# Patient Record
Sex: Male | Born: 1955
Health system: Southern US, Community
[De-identification: ages and names within clinical notes are randomized; demographics above are authoritative.]

## PROBLEM LIST (undated history)

## (undated) DIAGNOSIS — I10 Essential (primary) hypertension: Secondary | ICD-10-CM

## (undated) DIAGNOSIS — G35 Multiple sclerosis: Secondary | ICD-10-CM

## (undated) HISTORY — DX: Essential (primary) hypertension: I10

## (undated) HISTORY — DX: Multiple sclerosis: G35

## (undated) HISTORY — PX: ACHILLES TENDON REPAIR: SUR1153

---

## 1999-01-14 ENCOUNTER — Ambulatory Visit (HOSPITAL_COMMUNITY): Admission: RE | Admit: 1999-01-14 | Discharge: 1999-01-14 | Payer: Self-pay | Admitting: Gastroenterology

## 1999-11-25 ENCOUNTER — Encounter: Admission: RE | Admit: 1999-11-25 | Discharge: 1999-11-25 | Payer: Self-pay | Admitting: Sports Medicine

## 2000-02-11 DIAGNOSIS — G35 Multiple sclerosis: Secondary | ICD-10-CM | POA: Insufficient documentation

## 2001-01-26 ENCOUNTER — Encounter: Payer: Self-pay | Admitting: Otolaryngology

## 2001-01-26 ENCOUNTER — Ambulatory Visit (HOSPITAL_COMMUNITY): Admission: RE | Admit: 2001-01-26 | Discharge: 2001-01-26 | Payer: Self-pay | Admitting: Otolaryngology

## 2002-12-03 ENCOUNTER — Encounter: Payer: Self-pay | Admitting: Internal Medicine

## 2002-12-03 ENCOUNTER — Ambulatory Visit (HOSPITAL_COMMUNITY): Admission: RE | Admit: 2002-12-03 | Discharge: 2002-12-03 | Payer: Self-pay | Admitting: Internal Medicine

## 2007-10-27 ENCOUNTER — Encounter: Admission: RE | Admit: 2007-10-27 | Discharge: 2007-10-27 | Payer: Self-pay | Admitting: Internal Medicine

## 2011-03-28 ENCOUNTER — Encounter: Payer: Self-pay | Admitting: Pharmacist

## 2011-03-28 ENCOUNTER — Ambulatory Visit (INDEPENDENT_AMBULATORY_CARE_PROVIDER_SITE_OTHER): Payer: Self-pay | Admitting: Pharmacist

## 2011-03-28 VITALS — BP 122/78 | HR 80 | Ht 69.5 in | Wt 177.0 lb

## 2011-03-28 DIAGNOSIS — G35 Multiple sclerosis: Secondary | ICD-10-CM

## 2011-03-28 NOTE — Progress Notes (Signed)
  Subjective:    Patient ID: Antonio Cook, male    DOB: 1955/11/22, 56 y.o.   MRN: [id number]  HPI  Reviewed and agree with Dr. Macky Lower management    Review of Systems     Objective:   Physical Exam        Assessment & Plan:

## 2011-03-28 NOTE — Progress Notes (Signed)
  Subjective:    Patient ID: Antonio Cook, male    DOB: [birth year], 56 y.o.   MRN: [id number]  HPI  Patient arrives in good spirits for medication review.  Reports seeing Elmore Guise as primary care provider, Dr Avie Echevaria as neurologist.  Reports being diagnosed with MS since 2002 and states he is under acceptable level of control.     Review of Systems     Objective:   Physical Exam        Assessment & Plan:  Following medication review, no suggestions for change.  Complete medication list provided to patient.  Total time in face to face medication review: 15 minutes.

## 2011-03-28 NOTE — Assessment & Plan Note (Signed)
Following medication review, no suggestions for change.  Complete medication list provided to patient.  Total time in face to face medication review: 15 minutes.  

## 2012-07-02 ENCOUNTER — Other Ambulatory Visit: Payer: Self-pay

## 2012-07-02 DIAGNOSIS — G35 Multiple sclerosis: Secondary | ICD-10-CM

## 2012-07-02 MED ORDER — ZOLPIDEM TARTRATE 10 MG PO TABS: 10.0000 mg | ORAL_TABLET | Freq: Every day | ORAL | Status: DC

## 2012-07-02 NOTE — Telephone Encounter (Signed)
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  His previous Rx has expired.  He is a former Dr Sandria Manly patient who has had a transfer of care to Dr Anne Hahn.  Dr Anne Hahn is out of the office today.  Forwarding request to Dr Dohmeier WID this afternoon.

## 2012-11-01 ENCOUNTER — Encounter: Payer: Self-pay | Admitting: Neurology

## 2012-11-25 ENCOUNTER — Encounter (INDEPENDENT_AMBULATORY_CARE_PROVIDER_SITE_OTHER): Payer: Self-pay

## 2012-11-25 ENCOUNTER — Ambulatory Visit (INDEPENDENT_AMBULATORY_CARE_PROVIDER_SITE_OTHER): Payer: 59 | Admitting: Neurology

## 2012-11-25 ENCOUNTER — Encounter: Payer: Self-pay | Admitting: Neurology

## 2012-11-25 VITALS — BP 120/82 | HR 68 | Wt 175.0 lb

## 2012-11-25 DIAGNOSIS — G35 Multiple sclerosis: Secondary | ICD-10-CM

## 2012-11-25 MED ORDER — INTERFERON BETA-1A 44 MCG/0.5ML ~~LOC~~ SOLN: 44.0000 ug | SUBCUTANEOUS | Status: DC

## 2012-11-25 MED ORDER — ZOLPIDEM TARTRATE 10 MG PO TABS: 10.0000 mg | ORAL_TABLET | Freq: Every day | ORAL | Status: DC

## 2012-11-25 MED ORDER — MODAFINIL 200 MG PO TABS: 200.0000 mg | ORAL_TABLET | Freq: Every day | ORAL | Status: DC | PRN

## 2012-11-25 NOTE — Progress Notes (Signed)
   Reason for visit: Multiple sclerosis  BASHIR MARCHETTI is an 57 y.o. 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 The patient presented with facial myokymia, and decreased hearing. The patient had MRI evaluation that revealed evidence of typical demyelinating lesions that included the cerebellar peduncle. The patient has been placed on Rebif, and he has done well since being on this medication. Since 2002, the patient indicates that he has not had any clinical exacerbations. The patient does have some fatigue at times. The patient is tolerating the Rebif well, and he indicates that he gets annual blood work through his primary care physician that includes a CBC and a comprehensive metabolic profile. The patient has not had a MRI of the brain since 2002, and the patient is not interested in getting another one.  Past Medical History  Diagnosis Date  . MS (multiple sclerosis)     Past Surgical History  Procedure Laterality Date  . Achilles tendon repair Left     Family History  Problem Relation Age of Onset  . Lymphoma Mother   . Cancer Father   . Cancer Maternal Grandfather     Social history:  reports that he has never smoked. He has never used smokeless tobacco. He reports that he does not drink alcohol or use illicit drugs.   No Known Allergies  Medications:  No current outpatient prescriptions on file prior to visit.   No current facility-administered medications on file prior to visit.    ROS:  Out of a complete 14 system review of symptoms, the patient complains only of the following symptoms, and all other reviewed systems are negative.  Fatigue  Blood pressure 120/82, pulse 68, weight 175 lb (79.379 kg).  Physical Exam  General: The patient is alert and cooperative at the time of the examination.  Skin: No significant peripheral edema is  noted.   Neurologic Exam  Cranial nerves: Facial symmetry is present. Speech is normal, no aphasia or dysarthria is noted. Extraocular movements are full. Visual fields are full. Pupils are equal, round, and reactive to light. Discs are flat bilaterally.  Motor: The patient has good strength in all 4 extremities.  Coordination: The patient has good finger-nose-finger and heel-to-shin bilaterally.  Gait and station: The patient has a normal gait. Tandem gait is normal. Romberg is negative. No drift is seen.  Reflexes: Deep tendon reflexes are symmetric.   Assessment/Plan:  One. Multiple sclerosis  The patient has been quite stable on Rebif. The patient likely has a benign form of multiple sclerosis. The patient will be maintained on this medication for now. He will followup in one year.  Marlan Palau MD 11/25/2012 8:39 PM  [facility] [address] [place], [place] [address]  Phone [phone] Fax [phone]

## 2013-06-01 ENCOUNTER — Other Ambulatory Visit: Payer: Self-pay

## 2013-06-01 DIAGNOSIS — G35 Multiple sclerosis: Secondary | ICD-10-CM

## 2013-06-01 MED ORDER — ZOLPIDEM TARTRATE 10 MG PO TABS: 10.0000 mg | ORAL_TABLET | Freq: Every day | ORAL | Status: DC

## 2013-06-01 NOTE — Telephone Encounter (Signed)
Rx signed and faxed.

## 2013-11-24 ENCOUNTER — Encounter (INDEPENDENT_AMBULATORY_CARE_PROVIDER_SITE_OTHER): Payer: Self-pay

## 2013-11-24 ENCOUNTER — Encounter: Payer: Self-pay | Admitting: Neurology

## 2013-11-24 ENCOUNTER — Ambulatory Visit (INDEPENDENT_AMBULATORY_CARE_PROVIDER_SITE_OTHER): Payer: 59 | Admitting: Neurology

## 2013-11-24 VITALS — BP 126/80 | HR 85 | Ht 69.0 in | Wt 172.6 lb

## 2013-11-24 DIAGNOSIS — G35 Multiple sclerosis: Secondary | ICD-10-CM

## 2013-11-24 MED ORDER — MODAFINIL 200 MG PO TABS: 200.0000 mg | ORAL_TABLET | Freq: Every day | ORAL | Status: DC | PRN

## 2013-11-24 MED ORDER — INTERFERON BETA-1A 44 MCG/0.5ML ~~LOC~~ SOLN: 44.0000 ug | SUBCUTANEOUS | Status: DC

## 2013-11-24 MED ORDER — ZOLPIDEM TARTRATE 10 MG PO TABS: 10.0000 mg | ORAL_TABLET | Freq: Every day | ORAL | Status: DC

## 2013-11-24 NOTE — Progress Notes (Signed)
    Reason for visit: Multiple sclerosis  Antonio Cook is an 58 y.o. 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 The patient has done quite well for number of years on Rebif, without any further clinical exacerbations. The patient reports no new symptoms of vision changes, numbness or weakness, balance issues, or difficulty with control of the bowels or the bladder. The patient has not had any significant new medical issues that have come up since last seen other than the fact that he is now being treated for hypertension. The patient is tolerating the Rebif quite well. He indicates that his primary care physician checks a CBC and liver panel on an annual basis. He does report some fatigue issues, but he takes Provigil on occasion if needed.  Past Medical History  Diagnosis Date  . MS (multiple sclerosis)   . Hypertension     Past Surgical History  Procedure Laterality Date  . Achilles tendon repair Left     Family History  Problem Relation Age of Onset  . Lymphoma Mother   . Cancer Father   . Cancer Maternal Grandfather   . Multiple sclerosis Neg Hx     Social history:  reports that he has never smoked. He has never used smokeless tobacco. He reports that he does not drink alcohol or use illicit drugs.    Allergies  Allergen Reactions  . Hydrochloric Acid Itching and Rash    Medications:  No current outpatient prescriptions on file prior to visit.   No current facility-administered medications on file prior to visit.    ROS:  Out of a complete 14 system review of symptoms, the patient complains only of the following symptoms, and all other reviewed systems are negative.  Fatigue  Blood pressure 126/80, pulse 85, height 5\' 9"  (1.753 m), weight 172 lb 9.6 oz (78.291 kg).  Physical Exam  General: The patient is alert and cooperative at the time of the  examination.  Skin: No significant peripheral edema is noted.   Neurologic Exam  Mental status: The patient is oriented x 3.  Cranial nerves: Facial symmetry is present. Speech is normal, no aphasia or dysarthria is noted. Extraocular movements are full. Visual fields are full. Pupils are equal, round, and reactive to light. Discs are flat bilaterally.  Motor: The patient has good strength in all 4 extremities.  Sensory examination: Soft touch sensation is symmetric on the face, arms, and legs.  Coordination: The patient has good finger-nose-finger and heel-to-shin bilaterally.  Gait and station: The patient has a normal gait. Tandem gait is normal. Romberg is negative. No drift is seen.  Reflexes: Deep tendon reflexes are symmetric.   Assessment/Plan:  1. Multiple sclerosis  The patient is doing quite well on Rebif. The patient will continue this medication. He was given a prescription for Rebif, Ambien, and for Provigil. He will followup in one year or as needed.  Marlan Palau. Keith Jeremaih Klima MD 11/24/2013 8:32 PM  [facility] [address] [place], [place] [id number]  Phone [phone] Fax [phone]

## 2013-11-24 NOTE — Patient Instructions (Signed)

## 2014-01-10 ENCOUNTER — Other Ambulatory Visit: Payer: Self-pay | Admitting: Internal Medicine

## 2014-01-10 DIAGNOSIS — M545 Low back pain, unspecified: Secondary | ICD-10-CM

## 2014-01-10 DIAGNOSIS — R200 Anesthesia of skin: Secondary | ICD-10-CM

## 2014-01-10 DIAGNOSIS — G35 Multiple sclerosis: Secondary | ICD-10-CM

## 2014-01-17 ENCOUNTER — Ambulatory Visit (INDEPENDENT_AMBULATORY_CARE_PROVIDER_SITE_OTHER): Payer: 59 | Admitting: Sports Medicine

## 2014-01-17 ENCOUNTER — Encounter: Payer: Self-pay | Admitting: Sports Medicine

## 2014-01-17 VITALS — BP 137/88 | HR 90 | Ht 69.0 in | Wt 172.0 lb

## 2014-01-17 DIAGNOSIS — G35 Multiple sclerosis: Secondary | ICD-10-CM

## 2014-01-17 DIAGNOSIS — M25551 Pain in right hip: Secondary | ICD-10-CM

## 2014-01-17 NOTE — Assessment & Plan Note (Signed)
I do not think that MS is likely cause of current sxs  These seem injury related

## 2014-01-17 NOTE — Assessment & Plan Note (Signed)
We will use HEP to stabilize hip and core  stretch for piriformis daily  Finish prednisone taper  I think this will improve a lot with HEP and i want to recheck him in 6 weeks to see his response  I do not think he has discogenic pain

## 2014-01-17 NOTE — Progress Notes (Signed)
Patient ID: Antonio Cook, male   DOB: [birth year], 58 y.o.   MRN: [id number]  Subjective:   Patient presents with right buttock pain for the past 6 weeks.  Pain started while carrying laundry up stairs.  The pain was initially localized to his right lateral buttock.  He was seen by PCP and diagnosed with piriformis syndrome and started on scheduled Naprosyn which he has been taking for the past 6 weeks.  He had a deep tissue massage ~10 days ago and 2-3 days after this, he developed pain in right leg involving his posterior thigh, anterior knee, and anterior/lateral lower leg.  At this same time, he also developed sensation of numbness in his right foot.  He returned to PCP and was started on Prednisone taper which has resolved this right leg pain.  An MRI of his lumbar spine was also ordered but has not been performed.  He still has persistent right buttock pain and sensory deficit in his right foot but is significantly improved overall.  Right buttock pain is worse with prolonged sitting.  No fever, chills, night sweats, unexpected weight loss.  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  Past:  MS but no active lesions or sxs recently  Objective: BP 137/88 mmHg  Pulse 90  Ht 5\' 9"  (1.753 m)  Wt 172 lb (78.019 kg)  BMI 25.39 kg/m2 General: calm, cooperative, NAD HEENT: conj clear, sclera anicteric, [place]/AT Respiratory: breathing non-labored Cardiac: lower extremity pulses intact Neurologic: No obvious lower extremity sensory deficit; 4/5 strength in right extensor hallucis longus but strength otherwise normal/equal bilateral lower extremities; patellar and achilles reflexes normal/equal bilaterally Musculoskeletal: BACK AND HIP  Inspection: no gross deformity or leg length discrepancy  Palpation: TTP over right piriformis; no tenderness over greater trochanter or ASIS  Slump: negative bilaterally  Seated SLR: negative bilaterally  Supine SLR: negative bilaterally  FABER: negative  bilaterally  FADIR: negative bilaterally  Piriformis testing: reproduced right buttock pain  Hip Abduction: profoundly weak on right  Heel walking: difficulty on right due to extensor hallucis weakness  Internal Rotation of Hips: negative  Balance is poor on 1 leg stance  Assessment/Plan:  Patient is a 58 y/o M with history of multiple sclerosis who presents with right buttock pain and foot numbness with resolved right leg pain.    1.  Right Piriformis Syndrome 2.  Right Hip Abductor weakness 3.  Right Extensor Hallucis Longus weakness 4.  Multiple Sclerosis   It is likely that the initial injury to his piriformis occurred secondary to weak right hip abduction and poor core strength.  Subsequent deep tissue massage may have caused sciatic irritation which could explain his right leg pain/foot numbness.  He clearly seems to have responded to NSAIDS and prednisone course and has no reproducible radiculopathy on exam today.  Agree with previously ordered MRI of lumbar spine, especially in the setting of multiple sclerosis, to rule out plaque lesion.  Extensor hallucis longus weakness may be secondary to compressive neuropathy from deep tissue massage, reflex inhibition, or less likely a herniated disk.  Will do home exercises with focus on strengthening core and right hip abductors.  Patient can continue Prednisone and NSAIDS per PCP discretion although he was made aware that taking these together is not advised due to increased risk of GI side effects.  Patient to follow-up in 6 weeks.  Patient examined and discussed with Dr. Darrick Penna.  Mickle Plumb, MD PGY-3 Family Medicine  Reviewed and  edited/  Agree with this assessment Sterling BigKB Latorie Montesano, MD

## 2014-01-18 ENCOUNTER — Ambulatory Visit
Admission: RE | Admit: 2014-01-18 | Discharge: 2014-01-18 | Disposition: A | Discharge status: Home / Self Care | Payer: 59 | Source: Ambulatory Visit | Attending: Internal Medicine | Admitting: Internal Medicine

## 2014-01-18 DIAGNOSIS — G35 Multiple sclerosis: Secondary | ICD-10-CM

## 2014-01-18 DIAGNOSIS — M545 Low back pain, unspecified: Secondary | ICD-10-CM

## 2014-01-18 DIAGNOSIS — R200 Anesthesia of skin: Secondary | ICD-10-CM

## 2014-01-18 MED ORDER — GADOBENATE DIMEGLUMINE 529 MG/ML IV SOLN
15.0000 mL | Freq: Once | INTRAVENOUS | Status: AC | PRN
  Administered 2014-01-18: 15 mL via INTRAVENOUS

## 2014-01-24 ENCOUNTER — Ambulatory Visit: Payer: 59 | Admitting: Sports Medicine

## 2014-03-02 ENCOUNTER — Ambulatory Visit: Payer: 59 | Admitting: Sports Medicine

## 2014-03-09 ENCOUNTER — Ambulatory Visit (INDEPENDENT_AMBULATORY_CARE_PROVIDER_SITE_OTHER): Payer: 59 | Admitting: Sports Medicine

## 2014-03-09 ENCOUNTER — Encounter: Payer: Self-pay | Admitting: Sports Medicine

## 2014-03-09 VITALS — BP 122/86 | Ht 69.0 in | Wt 172.0 lb

## 2014-03-09 DIAGNOSIS — G35 Multiple sclerosis: Secondary | ICD-10-CM

## 2014-03-09 DIAGNOSIS — Z7189 Other specified counseling: Secondary | ICD-10-CM

## 2014-03-09 DIAGNOSIS — M25551 Pain in right hip: Secondary | ICD-10-CM

## 2014-03-10 NOTE — Progress Notes (Signed)
  Antonio Cook - 59 y.o. male MRN [id number]  Date of birth: [birth year]  SUBJECTIVE: CC: Right hip pain, follow-up HPI: Significant improvement following last visit. He has been performing exercises every 2-3 days. Reports essentially asymptomatic at this time. He is interested in discussing options for resuming his activity and increasing his health.  ROS: per HPI  HISTORY:  Past Medical, Surgical, Social, and Family History reviewed & updated per EMR.  Pertinent Historical Findings include:  reports that he has never smoked. He has never used smokeless tobacco. Nonsmoker, history of multiple sclerosis with minimal daily symptoms. Prior left Achilles repair Physician, internal medicine and psychiatrist Exercises for health  OBJECTIVE:  VS:   HT:5\' 9"  (175.3 cm)   WT:172 lb (78.019 kg)  BMI:25.5          BP:122/86 mmHg  HR: bpm  TEMP: ( )  RESP:   PHYSICAL EXAM:  Adult Caucasian male no acute distress he is alert and appropriately interactive. Good insight. Right hip exam: Strength is 5/5 bilaterally and hip abduction. He has TFL predominant on the right more than the left. He is nontender over the greater trochanter, piriformis, sciatic notch. Bilateral negative straight leg raise. He is able to heel and toe walk without difficulty.  ASSESSMENT: 1. Multiple sclerosis   2. Right hip pain   3. Counseling and coordination of care     PLAN: See problem based charting & AVS for additional documentation.  >50% of this 25 minute visit spent in direct patient counseling and/or coordination of care.   Discussed ACSM recommendations for both aerobic exercises and moderate intensity and exercise. Provided core strengthening regimen.   Rx Today: Exercise prescription  HEP: Core exercise regimen reviewed. Encouraged complex movement exercises. > Return if symptoms worsen or fail to improve.

## 2014-05-26 ENCOUNTER — Other Ambulatory Visit: Payer: Self-pay

## 2014-05-26 DIAGNOSIS — G35 Multiple sclerosis: Secondary | ICD-10-CM

## 2014-05-26 MED ORDER — ZOLPIDEM TARTRATE 10 MG PO TABS: 10.0000 mg | ORAL_TABLET | Freq: Every day | ORAL | Status: DC

## 2014-05-26 MED ORDER — MODAFINIL 200 MG PO TABS: 200.0000 mg | ORAL_TABLET | Freq: Every day | ORAL | Status: DC | PRN

## 2014-05-26 NOTE — Telephone Encounter (Signed)
Rx signed and faxed.

## 2014-10-05 ENCOUNTER — Ambulatory Visit (INDEPENDENT_AMBULATORY_CARE_PROVIDER_SITE_OTHER): Payer: 59 | Admitting: Sports Medicine

## 2014-10-05 ENCOUNTER — Encounter: Payer: Self-pay | Admitting: Sports Medicine

## 2014-10-05 VITALS — BP 119/87 | Ht 69.0 in | Wt 176.0 lb

## 2014-10-05 DIAGNOSIS — R29898 Other symptoms and signs involving the musculoskeletal system: Secondary | ICD-10-CM | POA: Diagnosis not present

## 2014-10-05 DIAGNOSIS — G35 Multiple sclerosis: Secondary | ICD-10-CM | POA: Diagnosis not present

## 2014-10-05 NOTE — Assessment & Plan Note (Signed)
I am concerned that his left lower extremity weakness is related to his MS  I would like him to return for neurology evaluation as this is a new change

## 2014-10-05 NOTE — Progress Notes (Signed)
Patient ID: Antonio Cook, male   DOB: 1955-03-06, 59 y.o.   MRN: [id number]  CC: left leg weakness  HPI:  59 year old man with PMH of benign MS here with complaint of left leg weakness x 6 weeks His MS has really not given him MSK issues in past 14 years  He notes that he gradually began to notice that the leg felt weak and unstable in relation to the other when walking No falls or trauma No pain, numbness, or tingling He states that it is not so pronounced that it is affecting him (was able to hike with his wife in [place] 3 weeks ago)  However, in [place] he used a knee brace as that made him feel more stable while walking  He has been feeling like it has improved in the past week Very worried that it is his first flare of MS (MS was diagnosed incidentally on brain MRI for hearing change 14 years ago) Takes MS medication daily (Rebif) Has had MRI of back in last year that showed bulging disc in lumbar region that feels could also contribute to this  Dr Anne Hahn follows him for neuro issues  BP 119/87 mmHg  Ht  (1.753 m)  Wt 176 lb (79.833 kg)  BMI 25.98 kg/m2 General: very pleasant, hygienic, no acute distress CV: well perfused, skin warm/pink Resp: normal WOB MSK:  Left lower extremity globally weaker than right in all planes of testing (hip flex/ext, knee flex/ext, ankle plantar/dorsiflexion, hip abduction). About 70-80% of strength of right. No fasciculations observed during prolonged strength testing.   Sensation grossly intact.  Reflexes symmetrical bilaterally, 1+.   Gait analysis: left leg with marked tremor on weight bearing as if unstable, left leg also tracking outward in relation to right.  Unablee to do toe walk 2/2 weakness but no pain/ same problem with heel walk/ i leg stand is unstable   Assessment:  59 year old man with PMH of benign MS here with complaint of left leg weakness x 6 weeks. He is concerned that it is an MS flare vs bulging disc. Based on the lack  of pain, numbness, tingling,neg exam for disk issues,  it is doubtful that it is a disc bulge. The global nature of the weakness and gait instability seems more associated with an MS flare.   PLAN:  Will recommend that pt try to get in with his neurologist as fast as possible for possible imaging vs medication titration Will recommend that he walk with a cane or other assistive device until strength can be maintained Will recommend that he avoid vigorous exercise that may endanger the stability of his leg.  Will recommend knee brace until strength can be maintained Pt can continue his passive leg lifts and will recommend isometric exercises to help maintain tone.   Enid Baas

## 2014-10-06 ENCOUNTER — Telehealth: Payer: Self-pay | Admitting: Neurology

## 2014-10-06 NOTE — Telephone Encounter (Signed)
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 Darrick Penna and also MS. The patient does have an appointment scheduled on 10-11-14 but would like a call back from Dr. Anne Hahn to discuss . I advised the patient it would probably be Monday for the call to be returned because Dr. Anne Hahn and his nurse are out of the office today. The patient was ok with that.  Thank you.

## 2014-10-06 NOTE — Telephone Encounter (Signed)
I called, and talked with the patient. 2 months ago, he had onset of some primarily motor changes in the left leg, feels as if he is dragging the leg some. No involvement of the arms. No sensory changes. The problem improved slightly, but it has not normalized. This may be a MS exacerbation, may consider MRI of the brain and cervical spinal cord with and without gadolinium, further blood work. He will be seen next week.

## 2014-10-09 ENCOUNTER — Encounter: Payer: Self-pay | Admitting: Sports Medicine

## 2014-10-11 ENCOUNTER — Encounter: Payer: Self-pay | Admitting: Neurology

## 2014-10-11 ENCOUNTER — Ambulatory Visit (INDEPENDENT_AMBULATORY_CARE_PROVIDER_SITE_OTHER): Payer: 59 | Admitting: Neurology

## 2014-10-11 VITALS — BP 140/97 | HR 89 | Ht 69.0 in | Wt 178.5 lb

## 2014-10-11 DIAGNOSIS — Z5181 Encounter for therapeutic drug level monitoring: Secondary | ICD-10-CM | POA: Diagnosis not present

## 2014-10-11 DIAGNOSIS — R29898 Other symptoms and signs involving the musculoskeletal system: Secondary | ICD-10-CM

## 2014-10-11 DIAGNOSIS — G35 Multiple sclerosis: Secondary | ICD-10-CM | POA: Diagnosis not present

## 2014-10-11 MED ORDER — ONDANSETRON HCL 4 MG PO TABS: 4.0000 mg | ORAL_TABLET | Freq: Three times a day (TID) | ORAL | Status: AC | PRN

## 2014-10-11 NOTE — Patient Instructions (Addendum)
   We will check MRI of the brain and cervical spine, check blood work.   Multiple Sclerosis Multiple sclerosis (MS) is a disease of the central nervous system. It leads to the loss of the insulating covering of the nerves (myelin sheath) of your brain. When this happens, brain signals do not get sent properly or may not get sent at all. The age of onset of MS varies.  CAUSES The cause of MS is unknown. However, it is more common in the [place] than in the [place]. RISK FACTORS There is a higher number of women with MS than men. MS is not an illness that is passed down to you from your family members (inherited). However, your risk of MS is higher if you have a relative with MS. SIGNS AND SYMPTOMS  The symptoms of MS occur in episodes or attacks. These attacks may last weeks to months. There may be long periods of almost no symptoms between attacks. The symptoms of MS vary. This is because of the many different ways it affects the central nervous system. The main symptoms of MS include:  Vision problems and eye pain.  Numbness.  Weakness.  Inability to move your arms, hands, feet, or legs (paralysis).  Balance problems.  Tremors. DIAGNOSIS  Your health care provider can diagnose MS with the help of imaging exams and lab tests. These may include specialized X-ray exams and spinal fluid tests. The best imaging exam to confirm a diagnosis of MS is an MRI. TREATMENT  There is no known cure for MS, but there are medicines that can decrease the number and frequency of attacks. Steroids are often used for short-term relief. Physical and occupational therapy may also help. There are also many new alternative or complementary treatments available to help control the symptoms of MS. Ask your health care provider if any of these other options are right for you. HOME CARE INSTRUCTIONS   Take medicines as directed by your health care provider.  Exercise as directed by  your health care provider. SEEK MEDICAL CARE IF: You begin to feel depressed. SEEK IMMEDIATE MEDICAL CARE IF:  You develop paralysis.  You have problems with bladder, bowel, or sexual function.  You develop mental changes, such as forgetfulness or mood swings.  You have a period of uncontrolled movements (seizure). Document Released: 01/25/2000 Document Revised: 02/01/2013 Document Reviewed: 10/04/2012 [facility] Patient Information 2015 [place], [place]. This information is not intended to replace advice given to you by your health care provider. Make sure you discuss any questions you have with your health care provider.

## 2014-10-11 NOTE — Progress Notes (Signed)
Reason for visit:  Multiple sclerosis  Antonio Cook is an 59 y.o. 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 The patient has had no exacerbations , but within the last 2 or 3 months, he has developed some slight weakness of the left leg, he feels as if he has to drag the leg in order to walk, he has not had any falls. The weakness seemed to improve initially, but now has plateaued over the last month or so. He denies any weakness, tingling sensations, or discomfort. He denies any problems with the arms, or with the right leg. He denies any issues controlling the bowels or the bladder, or any visual complaints, or memory problems. He has not had MRI evaluation of the brain in quite some time, the last documentation is in 2002. He comes to this office for an evaluation.  Past Medical History  Diagnosis Date  . MS (multiple sclerosis)   . Hypertension     Past Surgical History  Procedure Laterality Date  . Achilles tendon repair Left     Family History  Problem Relation Age of Onset  . Lymphoma Mother   . Cancer Father   . Cancer Maternal Grandfather   . Multiple sclerosis Neg Hx     Social history:  reports that he has never smoked. He has never used smokeless tobacco. He reports that he does not drink alcohol or use illicit drugs.    Allergies  Allergen Reactions  . Gadolinium Derivatives Nausea And Vomiting    Pt vomited after injection.   Marland Kitchen Hctz [Hydrochlorothiazide] Rash    Medications:  Prior to Admission medications   Medication Sig Start Date End Date Taking? Authorizing Provider  interferon beta-1a (REBIF) 44 MCG/0.5ML injection Inject 0.5 mLs (44 mcg total) into the skin 3 (three) times a week. M, W, F usual dosing days 11/24/13  Yes York Spaniel, MD  losartan (COZAAR) 100 MG tablet Take 100 mg by mouth daily.    Yes Historical Provider, MD  modafinil (PROVIGIL) 200 MG tablet Take 1 tablet (200 mg total) by mouth daily as needed. 05/26/14  Yes York Spaniel, MD  zolpidem (AMBIEN) 10 MG tablet Take 1 tablet (10 mg total) by mouth at bedtime. 05/26/14  Yes York Spaniel, MD    ROS:  Out of a complete 14 system review of symptoms, the patient complains only of the following symptoms, and all other reviewed systems are negative.   Left leg weakness  Blood pressure 140/97, pulse 89, height  (1.753 m), weight 178 lb 8 oz (80.967 kg).  Physical Exam  General: The patient is alert and cooperative at the time of the examination.  Skin: No significant peripheral edema is noted.   Neurologic Exam  Mental status: The patient is alert and oriented x 3 at the time of the examination. The patient has apparent normal recent and remote memory, with an apparently normal attention span and concentration ability.   Cranial nerves: Facial symmetry is present. Speech is normal, no aphasia or dysarthria is noted. Extraocular movements are full. Visual fields are full.  Pupils are equal, round, and reactive to light. Discs are flat bilaterally.  Motor: The patient has good strength in all 4 extremities.  Sensory examination: Soft touch sensation is symmetric on the face, arms, and legs.  Pinprick ,  soft touch, and vibration sensation are symmetric throughout.  Coordination: The patient has good finger-nose-finger and heel-to-shin bilaterally.  Gait and station: The patient has a normal gait. Tandem gait is normal. Romberg is negative. No drift is seen.  Reflexes: Deep tendon reflexes are symmetric.   Assessment/Plan:   1. Multiple sclerosis   The patient perceives some weakness of the left leg. He appears to have a relatively normal gait pattern, no observable weakness is seen on clinical examination. He may have had a mild MS exacerbation. He remains on Rebif. He is taking a low dose of this  drug. The patient will be set up for blood work today, we will consider getting beta interferon neutralizing antibodies. He wishes to be sent to physical therapy. He will follow-up in October 2016. MRI of the brain and cervical spine will be done.  Marlan Palau MD 10/11/2014 9:22 PM  [facility] [address] [place], [place] [address]  Phone [phone] Fax [phone]

## 2014-10-12 ENCOUNTER — Telehealth: Payer: Self-pay | Admitting: Neurology

## 2014-10-12 LAB — COMPREHENSIVE METABOLIC PANEL
ALT: 15 IU/L (ref 0–44)
AST: 14 IU/L (ref 0–40)
Albumin/Globulin Ratio: 1.7 (ref 1.1–2.5)
Albumin: 4.3 g/dL (ref 3.5–5.5)
Alkaline Phosphatase: 67 IU/L (ref 39–117)
BUN/Creatinine Ratio: 20 (ref 9–20)
BUN: 21 mg/dL (ref 6–24)
Bilirubin Total: <0.2 mg/dL (ref 0.0–1.2)
CO2: 22 mmol/L (ref 18–29)
Calcium: 9.3 mg/dL (ref 8.7–10.2)
Chloride: 102 mmol/L (ref 97–108)
Creatinine, Ser: 1.07 mg/dL (ref 0.76–1.27)
GFR calc Af Amer: 87 mL/min/{1.73_m2} (ref >59)
GFR calc non Af Amer: 76 mL/min/{1.73_m2} (ref >59)
Globulin, Total: 2.5 g/dL (ref 1.5–4.5)
Glucose: 106 mg/dL — ABNORMAL HIGH (ref 65–99)
Potassium: 4.5 mmol/L (ref 3.5–5.2)
Sodium: 141 mmol/L (ref 134–144)
Total Protein: 6.8 g/dL (ref 6.0–8.5)

## 2014-10-12 LAB — CBC WITH DIFFERENTIAL/PLATELET
Basophils Absolute: 0 10*3/uL (ref 0.0–0.2)
Basos: 0 %
EOS (ABSOLUTE): 0.2 10*3/uL (ref 0.0–0.4)
Eos: 2 %
Hematocrit: 42.1 % (ref 37.5–51.0)
Hemoglobin: 13.9 g/dL (ref 12.6–17.7)
Immature Grans (Abs): 0 10*3/uL (ref 0.0–0.1)
Immature Granulocytes: 0 %
Lymphocytes Absolute: 1.4 10*3/uL (ref 0.7–3.1)
Lymphs: 16 %
MCH: 28.3 pg (ref 26.6–33.0)
MCHC: 33 g/dL (ref 31.5–35.7)
MCV: 86 fL (ref 79–97)
Monocytes Absolute: 0.9 10*3/uL (ref 0.1–0.9)
Monocytes: 11 %
Neutrophils Absolute: 5.8 10*3/uL (ref 1.4–7.0)
Neutrophils: 71 %
Platelets: 194 10*3/uL (ref 150–379)
RBC: 4.92 x10E6/uL (ref 4.14–5.80)
RDW: 13.3 % (ref 12.3–15.4)
WBC: 8.3 10*3/uL (ref 3.4–10.8)

## 2014-10-12 NOTE — Telephone Encounter (Signed)
Patient called regarding physical therapy referral. Please call patient [phone].

## 2014-10-13 LAB — OTHER LAB TEST

## 2014-10-16 NOTE — Telephone Encounter (Signed)
I called the patient. The physical therapy order family has already been placed, and he has not heard anything in the next 3 or 4 days, he is to contact me.

## 2014-10-17 ENCOUNTER — Telehealth: Payer: Self-pay

## 2014-10-17 ENCOUNTER — Telehealth: Payer: Self-pay | Admitting: Neurology

## 2014-10-17 NOTE — Telephone Encounter (Signed)
Left voicemail relaying results. 

## 2014-10-17 NOTE — Telephone Encounter (Signed)
I called the patient and left a voicemail. The patient's referral was sent to [facility] office (phone number: [phone]).

## 2014-10-17 NOTE — Telephone Encounter (Signed)
-----   Message from York Spaniel, MD sent at 10/16/2014 10:05 AM EDT -----  The blood work results are unremarkable. The CBC and comprehensive metabolic profile were unremarkable. The beta interferon neutralizing antibody test is still pending. Please call patient. ----- Message -----    From: [facility] Lab Results In Interface    Sent: 10/12/2014   5:41 AM      To: York Spaniel, MD

## 2014-10-17 NOTE — Telephone Encounter (Signed)
Pt called and wants the name of the Physical Therapist he was referred to and the number. May call [phone] and may leave message. Thank you

## 2014-10-21 LAB — SPECIMEN STATUS REPORT

## 2014-10-21 LAB — INF-BETA NEUTRALIZING AB RFLX: IFN-Beta Neutralizing Ab Scrn: NEGATIVE

## 2014-10-23 ENCOUNTER — Telehealth: Payer: Self-pay | Admitting: Neurology

## 2014-10-23 NOTE — Telephone Encounter (Signed)
I called patient. Neutralizing antibodies for Betaseron are negative.

## 2014-10-24 ENCOUNTER — Telehealth: Payer: Self-pay | Admitting: Neurology

## 2014-10-24 ENCOUNTER — Ambulatory Visit
Admission: RE | Admit: 2014-10-24 | Discharge: 2014-10-24 | Disposition: A | Discharge status: Home / Self Care | Payer: 59 | Source: Ambulatory Visit | Attending: Neurology | Admitting: Neurology

## 2014-10-24 DIAGNOSIS — R29898 Other symptoms and signs involving the musculoskeletal system: Secondary | ICD-10-CM

## 2014-10-24 DIAGNOSIS — G35 Multiple sclerosis: Secondary | ICD-10-CM | POA: Diagnosis not present

## 2014-10-24 MED ORDER — GADOBENATE DIMEGLUMINE 529 MG/ML IV SOLN
16.0000 mL | Freq: Once | INTRAVENOUS | Status: AC | PRN
  Administered 2014-10-24: 16 mL via INTRAVENOUS

## 2014-10-24 NOTE — Telephone Encounter (Addendum)
I called patient, left a message on the cell phone number, the home number does not seem to be accurate. I will track on back tomorrow concerning the results of the MRI. No acute demyelinating plaques, everything appears to be chronic in nature, no recent MRI evaluation for comparison.   MRI brain 10/24/2014:  IMPRESSION:  Abnormal MRI brain (with and without) demonstrating: 1. Scattered small round and ovoid periventricular and subcortical chronic demyelinating plaques. Several of these are hypointense on T1 views. 2. No acute plaques.   MRI cervical spine 10/24/2014:  IMPRESSION:  Mildly abnormal MRI cervical spine (with and without) demonstrating: 1. Subtle T2STIR hyperintensities at upper C2 level (series 3 image 9) and at T3 level (series 7 image 9), on sagittal views. May represent chronic demyelinating plaques vs artifact. 2. Mild disc bulging from C3-4 down to C5-6. No spinal stenosis or foraminal narrowing.

## 2014-10-25 NOTE — Telephone Encounter (Signed)
I called the patient again, left a message. The MRI studies are stable, no enhancing MS plaques are noted. The neutralizing antibodies for beta interferon is negative, the patient could continue on with his current therapy.

## 2014-10-25 NOTE — Telephone Encounter (Signed)
Patient called regarding MRI results.

## 2014-10-25 NOTE — Telephone Encounter (Signed)
I called the patient, discussed MRI results, he was taking one half of the recommended dose of the Rebif, he will go to the full dose.

## 2014-11-03 ENCOUNTER — Ambulatory Visit: Payer: 59 | Admitting: Neurology

## 2014-11-27 ENCOUNTER — Other Ambulatory Visit: Payer: Self-pay

## 2014-11-27 DIAGNOSIS — G35 Multiple sclerosis: Secondary | ICD-10-CM

## 2014-11-27 MED ORDER — MODAFINIL 200 MG PO TABS: 200.0000 mg | ORAL_TABLET | Freq: Every day | ORAL | Status: DC | PRN

## 2014-11-27 MED ORDER — ZOLPIDEM TARTRATE 10 MG PO TABS: 10.0000 mg | ORAL_TABLET | Freq: Every day | ORAL | Status: DC

## 2014-11-27 NOTE — Telephone Encounter (Signed)
Rx signed and faxed.

## 2014-11-30 ENCOUNTER — Ambulatory Visit (INDEPENDENT_AMBULATORY_CARE_PROVIDER_SITE_OTHER): Payer: 59 | Admitting: Neurology

## 2014-11-30 ENCOUNTER — Encounter: Payer: Self-pay | Admitting: Neurology

## 2014-11-30 VITALS — BP 139/94 | HR 89 | Ht 69.0 in | Wt 176.5 lb

## 2014-11-30 DIAGNOSIS — G35 Multiple sclerosis: Secondary | ICD-10-CM | POA: Diagnosis not present

## 2014-11-30 MED ORDER — INTERFERON BETA-1A 44 MCG/0.5ML ~~LOC~~ SOAJ: 44.0000 ug | SUBCUTANEOUS | Status: DC

## 2014-11-30 NOTE — Patient Instructions (Signed)
Multiple Sclerosis °Multiple sclerosis (MS) is a disease of the central nervous system. It leads to the loss of the insulating covering of the nerves (myelin sheath) of your brain. When this happens, brain signals do not get sent properly or may not get sent at all. The age of onset of MS varies.  °CAUSES °The cause of MS is unknown. However, it is more common in the northern [place] than in the southern [place]. °RISK FACTORS °There is a higher number of women with MS than men. MS is not an illness that is passed down to you from your family members (inherited). However, your risk of MS is higher if you have a relative with MS. °SIGNS AND SYMPTOMS  °The symptoms of MS occur in episodes or attacks. These attacks may last weeks to months. There may be long periods of almost no symptoms between attacks. The symptoms of MS vary. This is because of the many different ways it affects the central nervous system. The main symptoms of MS include: °· Vision problems and eye pain. °· Numbness. °· Weakness. °· Inability to move your arms, hands, feet, or legs (paralysis). °· Balance problems. °· Tremors. °DIAGNOSIS  °Your health care provider can diagnose MS with the help of imaging exams and lab tests. These may include specialized X-ray exams and spinal fluid tests. The best imaging exam to confirm a diagnosis of MS is an MRI. °TREATMENT  °There is no known cure for MS, but there are medicines that can decrease the number and frequency of attacks. Steroids are often used for short-term relief. Physical and occupational therapy may also help. There are also many new alternative or complementary treatments available to help control the symptoms of MS. Ask your health care provider if any of these other options are right for you. °HOME CARE INSTRUCTIONS  °· Take medicines as directed by your health care provider. °· Exercise as directed by your health care provider. °SEEK MEDICAL CARE IF: °You begin to feel  depressed. °SEEK IMMEDIATE MEDICAL CARE IF: °· You develop paralysis. °· You have problems with bladder, bowel, or sexual function. °· You develop mental changes, such as forgetfulness or mood swings. °· You have a period of uncontrolled movements (seizure). °  °This information is not intended to replace advice given to you by your health care provider. Make sure you discuss any questions you have with your health care provider. °  °Document Released: 01/25/2000 Document Revised: 02/01/2013 Document Reviewed: 10/04/2012 °Elsevier Interactive Patient Education ©2016 Elsevier Inc. ° °

## 2014-11-30 NOTE — Progress Notes (Signed)
Reason for visit: Multiple sclerosis  Antonio Cook is an 59 y.o. 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 The patient recently had some exacerbation of left leg weakness, some slight difficulty with walking. The patient has done better with some physical therapy. He has undergone blood work that has shown absence of neutralizing antibodies for beta interferon. He was not taking the full dose of the Rebif, he was on half strength. The patient has gone back up to a full dose. He is tolerating the medication well. He denies any new medical issues that have come up since last seen. Overall, he is feeling much better. MRI of the brain and cervical spinal cord was done, no enhancing lesions were noted.  Past Medical History  Diagnosis Date  . MS (multiple sclerosis) (HCC)   . Hypertension     Past Surgical History  Procedure Laterality Date  . Achilles tendon repair Left     Family History  Problem Relation Age of Onset  . Lymphoma Mother   . Cancer Father   . Cancer Maternal Grandfather   . Multiple sclerosis Neg Hx     Social history:  reports that he has never smoked. He has never used smokeless tobacco. He reports that he does not drink alcohol or use illicit drugs.    Allergies  Allergen Reactions  . Gadolinium Derivatives Nausea And Vomiting    Pt vomited after injection.   Marland Kitchen Hctz [Hydrochlorothiazide] Rash    Medications:  Prior to Admission medications   Medication Sig Start Date End Date Taking? Authorizing Provider  interferon beta-1a (REBIF) 44 MCG/0.5ML injection Inject 0.5 mLs (44 mcg total) into the skin 3 (three) times a week. M, W, F usual dosing days 11/24/13  Yes York Spaniel, MD  losartan (COZAAR) 100 MG tablet Take 100 mg by mouth daily.   Yes Historical Provider, MD  modafinil (PROVIGIL) 200 MG tablet Take 1 tablet (200 mg total) by mouth daily as needed.  11/27/14  Yes York Spaniel, MD  ondansetron (ZOFRAN) 4 MG tablet Take 1 tablet (4 mg total) by mouth every 8 (eight) hours as needed for nausea or vomiting. 10/11/14  Yes York Spaniel, MD  zolpidem (AMBIEN) 10 MG tablet Take 1 tablet (10 mg total) by mouth at bedtime. 11/27/14  Yes York Spaniel, MD    ROS:  Out of a complete 14 system review of symptoms, the patient complains only of the following symptoms, and all other reviewed systems are negative.  Gait disorder  Blood pressure 139/94, pulse 89, height  (1.753 m), weight 176 lb 8 oz (80.06 kg).  Physical Exam  General: The patient is alert and cooperative at the time of the examination.  Skin: No significant peripheral edema is noted.   Neurologic Exam  Mental status: The patient is alert and oriented x 3 at the time of the examination. The patient has apparent normal recent and remote memory, with an apparently normal attention span and concentration ability.   Cranial nerves: Facial symmetry is present. Speech is normal, no aphasia or dysarthria is noted. Extraocular movements are full. Visual fields are full.  Motor: The patient has good strength in all 4 extremities.  Sensory examination: Soft touch sensation is symmetric on the face, arms, and legs.  Coordination: The patient has good finger-nose-finger and heel-to-shin bilaterally. Some apraxia with the use of the left lower  extremity was noted.  Gait and station: The patient has a normal gait. Tandem gait is normal. Romberg is negative. No drift is seen.  Reflexes: Deep tendon reflexes are symmetric.    MRI brain 10/24/2014:  IMPRESSION:  Abnormal MRI brain (with and without) demonstrating: 1. Scattered small round and ovoid periventricular and subcortical chronic demyelinating plaques. Several of these are hypointense on T1 views. 2. No acute plaques.  * MRI scan images were reviewed online. I agree with the written report.    MRI  cervical spine 10/24/2014:  IMPRESSION:  Mildly abnormal MRI cervical spine (with and without) demonstrating: 1. Subtle T2STIR hyperintensities at upper C2 level (series 3 image 9) and at T3 level (series 7 image 9), on sagittal views. May represent chronic demyelinating plaques vs artifact. 2. Mild disc bulging from C3-4 down to C5-6. No spinal stenosis or foraminal narrowing.  Assessment/Plan:  1. Multiple sclerosis  The patient likely had a mild exacerbation of the multiple sclerosis recently. The patient has gone up to full dose Rebif, we will follow his examination over time. The patient will follow-up in 6 months, prescription for the Rebif was called in.  Marlan Palau MD 11/30/2014 8:36 PM  [facility] [address] [place], [place] [address]  Phone [phone] Fax [phone]

## 2014-12-01 ENCOUNTER — Other Ambulatory Visit: Payer: Self-pay

## 2014-12-01 MED ORDER — INTERFERON BETA-1A 44 MCG/0.5ML ~~LOC~~ SOAJ: 44.0000 ug | SUBCUTANEOUS | Status: DC

## 2014-12-05 ENCOUNTER — Telehealth: Payer: Self-pay | Admitting: Neurology

## 2014-12-05 NOTE — Telephone Encounter (Signed)
Dr. Donell Beers called regarding Interferon Beta-1a (REBIF REBIDOSE) 44 MCG/0.5ML SOAJ. Patient received a call from [facility] regarding delivery of medicine and patient does NOT use [place]. Rx needs to be sent to [facility]. Call if questions [phone].

## 2014-12-05 NOTE — Telephone Encounter (Signed)
I called the patient and left a voicemail.

## 2014-12-05 NOTE — Telephone Encounter (Signed)
It appears this Rx was sent to [facility] on 10/21.  I called [facility] pharmacy.  They verified they do have his Rx there and said they spoke with Dr Donell Beers regarding this.

## 2015-01-23 ENCOUNTER — Telehealth: Payer: Self-pay | Admitting: Neurology

## 2015-01-23 NOTE — Telephone Encounter (Signed)
Pt called and wanted to about getting the shingles vaccine.He has some questions [phone]

## 2015-01-24 NOTE — Telephone Encounter (Signed)
I called the patient and left a voicemail stating that, per Dr. Anne Hahn, it is safe to get the shingles vaccine. If he has any other questions he will call back.

## 2015-02-21 DIAGNOSIS — R29898 Other symptoms and signs involving the musculoskeletal system: Secondary | ICD-10-CM | POA: Diagnosis not present

## 2015-02-21 DIAGNOSIS — G35 Multiple sclerosis: Secondary | ICD-10-CM | POA: Diagnosis not present

## 2015-02-26 MED FILL — ZOLPIDEM TARTRATE 10 MG TAB: 10 | 30 days supply | Qty: 30 | Fill #3

## 2015-03-08 MED FILL — REBIF 44 MCG/0.5ML SOSY: 44 | 27 days supply | Qty: 6 | Fill #3

## 2015-03-21 DIAGNOSIS — G35 Multiple sclerosis: Secondary | ICD-10-CM | POA: Diagnosis not present

## 2015-03-21 DIAGNOSIS — R29898 Other symptoms and signs involving the musculoskeletal system: Secondary | ICD-10-CM | POA: Diagnosis not present

## 2015-03-28 MED FILL — ZOLPIDEM TARTRATE 10 MG TAB: 10 | 30 days supply | Qty: 30 | Fill #4

## 2015-04-11 MED FILL — REBIF 44 MCG/0.5ML SOSY: 44 | 27 days supply | Qty: 6 | Fill #4

## 2015-04-24 MED FILL — LOSARTAN POTASSIUM 100 MG T: 100 | 90 days supply | Qty: 90 | Fill #2

## 2015-04-24 MED FILL — ZOLPIDEM TARTRATE 10 MG TAB: 10 | 30 days supply | Qty: 30 | Fill #5

## 2015-05-17 MED FILL — REBIF 44 MCG/0.5ML SOSY: 44 | 27 days supply | Qty: 6 | Fill #5

## 2015-05-22 ENCOUNTER — Telehealth: Payer: Self-pay | Admitting: Neurology

## 2015-05-22 DIAGNOSIS — G35 Multiple sclerosis: Secondary | ICD-10-CM

## 2015-05-22 MED ORDER — ZOLPIDEM TARTRATE 10 MG PO TABS: 10.0000 mg | ORAL_TABLET | Freq: Every day | ORAL | Status: DC

## 2015-05-22 NOTE — Telephone Encounter (Signed)
Ambien was refilled

## 2015-05-22 NOTE — Telephone Encounter (Signed)
Pt is requesting a refill on zolpidem (AMBIEN) 10 MG tablet. Thank you

## 2015-05-23 MED FILL — ZOLPIDEM TARTRATE 10 MG TAB: 10 | 30 days supply | Qty: 30 | Fill #0

## 2015-05-23 NOTE — Telephone Encounter (Signed)
Printed, signed, up front for pt pick-up.

## 2015-06-04 ENCOUNTER — Telehealth: Payer: Self-pay | Admitting: Neurology

## 2015-06-04 NOTE — Telephone Encounter (Signed)
I called the patient. He is having nocturnal leg cramps fairly frequently recently. He may try magnesium supplementation at night, if this is not effective, he will call and I will call in a prescription for baclofen.

## 2015-06-04 NOTE — Telephone Encounter (Signed)
Patient called, would like to speak directly with Dr. Anne Hahn regarding spasms in legs. He will take a break around 4:15pm and call back to see if Dr. Anne Hahn is available to speak with him. Phone# [phone].

## 2015-06-04 NOTE — Telephone Encounter (Signed)
Pt called back, he can talk anytime after 5:15

## 2015-06-07 ENCOUNTER — Ambulatory Visit: Payer: 59 | Admitting: Neurology

## 2015-06-21 MED FILL — ZOLPIDEM TARTRATE 10 MG TAB: 10 | 30 days supply | Qty: 30 | Fill #1

## 2015-06-21 MED FILL — REBIF 44 MCG/0.5ML SOSY: 44 | 27 days supply | Qty: 6 | Fill #6

## 2015-06-25 ENCOUNTER — Telehealth: Payer: Self-pay | Admitting: Neurology

## 2015-06-25 NOTE — Telephone Encounter (Signed)
Pt called said Dr Vickey Huger had talked about another neurologist in [facility] that was M/S specialist. He would like the name again, please leave on VM. Thanks

## 2015-07-23 MED FILL — ZOLPIDEM TARTRATE 10 MG TAB: 10 | 30 days supply | Qty: 30 | Fill #2

## 2015-07-23 MED FILL — LOSARTAN POTASSIUM 100 MG T: 100 | 90 days supply | Qty: 90 | Fill #3

## 2015-08-02 MED FILL — REBIF 44 MCG/0.5ML SOSY: 44 | 27 days supply | Qty: 6 | Fill #7

## 2015-08-21 MED FILL — ZOLPIDEM TARTRATE 10 MG TAB: 10 | 30 days supply | Qty: 30 | Fill #3

## 2015-09-19 MED FILL — ZOLPIDEM TARTRATE 10 MG TAB: 10 | 30 days supply | Qty: 30 | Fill #4

## 2015-09-20 ENCOUNTER — Telehealth: Payer: Self-pay | Admitting: Neurology

## 2015-09-20 MED ORDER — INTERFERON BETA-1A 44 MCG/0.5ML ~~LOC~~ SOAJ: 44.0000 ug | SUBCUTANEOUS | 11 refills | Status: DC

## 2015-09-20 NOTE — Addendum Note (Signed)
Addended by: Donnelly Angelica on: 09/20/2015 09:44 AM   Modules accepted: Orders

## 2015-09-20 NOTE — Telephone Encounter (Signed)
Refills sent in to [facility] as requested.

## 2015-09-20 NOTE — Telephone Encounter (Signed)
Patient requesting refill of Interferon Beta-1a (REBIF REBIDOSE) 44 MCG/0.5ML SOAJ Pharmacy: [facility] pharmacy [phone], pt said to call to place the rx order

## 2015-09-21 NOTE — Telephone Encounter (Addendum)
Rebif [id number] approved through 09/20/2020. Notified [facility] specialty pharmacy and patient of approval via TC.

## 2015-10-03 ENCOUNTER — Telehealth: Payer: Self-pay | Admitting: Neurology

## 2015-10-03 MED ORDER — INTERFERON BETA-1A 44 MCG/0.5ML ~~LOC~~ SOSY: 44.0000 ug | PREFILLED_SYRINGE | SUBCUTANEOUS | 11 refills | Status: DC

## 2015-10-03 NOTE — Telephone Encounter (Signed)
LM for patient stating that we will correct the Rx. New Rx sent in

## 2015-10-03 NOTE — Telephone Encounter (Signed)
[facility] called stating they spoke with the pt who is requesting prefilled syringes not the needle. They need a new rx. Please call  [phone]

## 2015-10-03 NOTE — Telephone Encounter (Signed)
Pt returned call. I relayed to him the nurse is working on Clinical biochemist. He expressed understanding.

## 2015-10-03 NOTE — Telephone Encounter (Signed)
Dr. Donell BeersPlovsky called requesting to speak to Dr. Anne HahnWillis or Nurse on the phone regarding a mistake that was made with Interferon Beta-1a (REBIF REBIDOSE) 44 MCG/0.5ML SOAJ prescription. Requests pre-filled syringes not the auto injectable. Please call [phone].

## 2015-10-04 ENCOUNTER — Other Ambulatory Visit: Payer: Self-pay

## 2015-10-04 NOTE — Telephone Encounter (Signed)
Jennifer/ [facility] called in states the rx that was sent is not what the pt is requesting. He wants the pre-filled syringes that way he can control how fast it goes. May [phone].

## 2015-10-04 NOTE — Progress Notes (Signed)
Error

## 2015-10-04 NOTE — Telephone Encounter (Signed)
Returned call to [facility] who stated that they did not get the new rx for pre-filled syringes that was e-scribed yesterday. Asked that the rx be faxed to [phone]. Faxed as requested.

## 2015-10-10 ENCOUNTER — Telehealth: Payer: Self-pay | Admitting: Neurology

## 2015-10-10 NOTE — Telephone Encounter (Signed)
I called patient. The patient has been seen by Dr. Renne Crigler at [facility]. The patient will stay on the Rebif. He wishes to try Ampyra for the walking issue. We need a 25 foot walk timed test documented. The patient will do this on his own and contact me with the results. I will call in a prescription at that time.

## 2015-10-10 NOTE — Telephone Encounter (Signed)
Patient called requesting for Dr. Anne HahnWillis to call him back, advises this is not an emergency.

## 2015-10-11 ENCOUNTER — Other Ambulatory Visit: Payer: Self-pay | Admitting: *Deleted

## 2015-10-11 DIAGNOSIS — G35 Multiple sclerosis: Secondary | ICD-10-CM

## 2015-10-11 MED ORDER — MODAFINIL 200 MG PO TABS: 200.0000 mg | ORAL_TABLET | Freq: Every day | ORAL | 5 refills | Status: DC | PRN

## 2015-10-11 NOTE — Progress Notes (Signed)
Rx for Provigil refills printed, signed, faxed to pharmacy.

## 2015-10-17 MED FILL — MODAFINIL 200 MG TABLET: 200 | 30 days supply | Qty: 30 | Fill #0

## 2015-10-17 MED FILL — LOSARTAN POTASSIUM 100 MG T: 100 | 90 days supply | Qty: 90 | Fill #0

## 2015-10-18 MED FILL — ZOLPIDEM TARTRATE 10 MG TAB: 10 | 30 days supply | Qty: 30 | Fill #5

## 2015-11-14 ENCOUNTER — Telehealth: Payer: Self-pay

## 2015-11-14 NOTE — Telephone Encounter (Signed)
Called pt and appt was r/s from 11/22/15 to the following week @ 12:15 p.m.

## 2015-11-15 ENCOUNTER — Other Ambulatory Visit: Payer: Self-pay

## 2015-11-15 DIAGNOSIS — G35 Multiple sclerosis: Secondary | ICD-10-CM

## 2015-11-15 MED ORDER — ZOLPIDEM TARTRATE 10 MG PO TABS: 10.0000 mg | ORAL_TABLET | Freq: Every day | ORAL | 5 refills | Status: DC

## 2015-11-15 MED FILL — ZOLPIDEM TARTRATE 10 MG TAB: 10 | 30 days supply | Qty: 30 | Fill #0

## 2015-11-21 ENCOUNTER — Telehealth: Payer: Self-pay | Admitting: Neurology

## 2015-11-21 MED ORDER — DALFAMPRIDINE ER 10 MG PO TB12: 10.0000 mg | ORAL_TABLET | Freq: Two times a day (BID) | ORAL | 3 refills | Status: DC

## 2015-11-21 NOTE — Telephone Encounter (Signed)
I called patient. He has performed a 25 foot walk test, 4 laps took him 27 seconds.  He is interested in starting Ampyra. I will try to get the prescription covered.

## 2015-11-21 NOTE — Telephone Encounter (Signed)
Patient called requesting to speak to Dr. Anne Hahn or nurse Victorino Dike regarding a medication question. Please call [phone].

## 2015-11-21 NOTE — Addendum Note (Signed)
Addended by: Stephanie Acre on: 11/21/2015 05:29 PM   Modules accepted: Orders

## 2015-11-21 NOTE — Telephone Encounter (Signed)
Left VM mssg for patient to return call.

## 2015-11-22 ENCOUNTER — Ambulatory Visit: Payer: 59 | Admitting: Neurology

## 2015-11-22 NOTE — Telephone Encounter (Signed)
Rx printed, signed, faxed to pharmacy. 

## 2015-11-29 ENCOUNTER — Encounter: Payer: Self-pay | Admitting: Neurology

## 2015-11-29 ENCOUNTER — Ambulatory Visit (INDEPENDENT_AMBULATORY_CARE_PROVIDER_SITE_OTHER): Payer: No Typology Code available for payment source | Admitting: Neurology

## 2015-11-29 VITALS — BP 142/88 | HR 92 | Ht 69.0 in | Wt 177.0 lb

## 2015-11-29 DIAGNOSIS — G35 Multiple sclerosis: Secondary | ICD-10-CM | POA: Diagnosis not present

## 2015-11-29 NOTE — Telephone Encounter (Signed)
PA approved by [facility] ([id number]) through 05/29/16 - pt VW#[id number] - NF#[id number]- PA#[id number].

## 2015-11-29 NOTE — Progress Notes (Signed)
Reason for visit: Multiple sclerosis  Antonio Cook is an 60 y.o. male  History of present illness:  Antonio Cook is a 60 year old right-handed white male with a history of multiple sclerosis with mild symptoms, he is reporting some slight left leg fatigue and weakness. The patient is to go on Ampyra in the near future. He takes Provigil on the days after his Rebif injection which seems to help the fatigue issues. The patient has been to [facility] to see Dr. Renne Crigler for second opinion. The patient was told to stay on the Rebif. The patient has done quite well over time. He denies any falls, he denies any new numbness or weakness of the face, arms, or legs. He did have a recent episode of scintillating scotoma and a crescent shape in the left homonymous visual field that went away after 15 minutes associated with a mild headache afterwards. The patient does not report a prior history of migraine.  Past Medical History:  Diagnosis Date  . Hypertension   . MS (multiple sclerosis) (HCC)     Past Surgical History:  Procedure Laterality Date  . ACHILLES TENDON REPAIR Left     Family History  Problem Relation Age of Onset  . Lymphoma Mother   . Cancer Father   . Cancer Maternal Grandfather   . Multiple sclerosis Neg Hx     Social history:  reports that he has never smoked. He has never used smokeless tobacco. He reports that he does not drink alcohol or use drugs.    Allergies  Allergen Reactions  . Gadolinium Derivatives Nausea And Vomiting    Pt vomited after injection.   Marland Kitchen Hctz [Hydrochlorothiazide] Rash    Medications:  Prior to Admission medications   Medication Sig Start Date End Date Taking? Authorizing Provider  Cholecalciferol (VITAMIN D-1000 MAX ST) 1000 units tablet Take by mouth.    Historical Provider, MD  dalfampridine (AMPYRA) 10 MG TB12 Take 1 tablet (10 mg total) by mouth 2 (two) times daily. 11/21/15   York Spaniel, MD  interferon beta-1a (REBIF) 44  MCG/0.5ML SOSY injection Inject 0.5 mLs (44 mcg total) into the skin 3 (three) times a week. 10/03/15   York Spaniel, MD  losartan (COZAAR) 100 MG tablet Take 100 mg by mouth daily.    Historical Provider, MD  Magnesium 250 MG TABS Take 250 mg by mouth.    Historical Provider, MD  modafinil (PROVIGIL) 200 MG tablet Take 1 tablet (200 mg total) by mouth daily as needed. 10/11/15   York Spaniel, MD  ondansetron (ZOFRAN) 4 MG tablet Take 1 tablet (4 mg total) by mouth every 8 (eight) hours as needed for nausea or vomiting. 10/11/14   York Spaniel, MD  zolpidem (AMBIEN) 10 MG tablet Take 1 tablet (10 mg total) by mouth at bedtime. 11/22/15   York Spaniel, MD    ROS:  Out of a complete 14 system review of symptoms, the patient complains only of the following symptoms, and all other reviewed systems are negative.  Leg weakness  Blood pressure (!) 142/88, pulse 92, height 5\' 9"  (1.753 m), weight 177 lb (80.3 kg).  Physical Exam  General: The patient is alert and cooperative at the time of the examination.  Skin: No significant peripheral edema is noted.   Neurologic Exam  Mental status: The patient is alert and oriented x 3 at the time of the examination. The patient has apparent normal recent and remote memory, with  an apparently normal attention span and concentration ability.   Cranial nerves: Facial symmetry is present. Speech is normal, no aphasia or dysarthria is noted. Extraocular movements are full. Visual fields are full. Pupils are equal, round, and reactive to light. Discs are flat bilaterally.  Motor: The patient has good strength in all 4 extremities.  Sensory examination: Soft touch sensation is symmetric on the face, arms, and legs.  Coordination: The patient has good finger-nose-finger and heel-to-shin bilaterally.  Gait and station: The patient has a normal gait. Tandem gait is normal. Romberg is negative. No drift is seen.  Reflexes: Deep tendon reflexes  are symmetric.   Assessment/Plan:  1. Multiple sclerosis  The patient will get on Ampyra, he will remain on Rebif. He is getting annual blood work done through his primary care physician. He will follow-up through this office in 6 months, sooner if needed.  Marlan Palau. Keith Shanea Karney MD 11/29/2015 12:45 PM  [facility] [address] [place], [place] [id number]  Phone [phone] Fax [phone]

## 2015-12-14 MED FILL — ZOLPIDEM TARTRATE 10 MG TAB: 10 | 30 days supply | Qty: 30 | Fill #1

## 2016-01-14 MED FILL — LOSARTAN POTASSIUM 100 MG T: 100 | 90 days supply | Qty: 90 | Fill #1

## 2016-01-14 MED FILL — ZOLPIDEM TARTRATE 10 MG TAB: 10 | 30 days supply | Qty: 30 | Fill #2

## 2016-01-21 MED FILL — GAVILYTE-N SOLUTION: 420 | 1 days supply | Qty: 4000 | Fill #0

## 2016-02-07 MED FILL — MODAFINIL 200 MG TABLET: 200 | 30 days supply | Qty: 30 | Fill #1

## 2016-02-13 MED FILL — ZOLPIDEM TARTRATE 10 MG TAB: 10 | 30 days supply | Qty: 30 | Fill #3

## 2016-02-15 MED FILL — LOSARTAN POTASSIUM 100 MG T: 100 | 90 days supply | Qty: 90 | Fill #2

## 2016-03-14 MED FILL — ZOLPIDEM TARTRATE 10 MG TAB: 10 | 30 days supply | Qty: 30 | Fill #4

## 2016-04-10 ENCOUNTER — Other Ambulatory Visit: Payer: Self-pay | Admitting: *Deleted

## 2016-04-10 DIAGNOSIS — G35 Multiple sclerosis: Secondary | ICD-10-CM

## 2016-04-10 MED ORDER — ZOLPIDEM TARTRATE 10 MG PO TABS: 10.0000 mg | ORAL_TABLET | Freq: Every day | ORAL | 5 refills | Status: DC

## 2016-04-10 NOTE — Progress Notes (Signed)
Faxed printed/signed rx refill for ambien to pt pharmacy. Fa: [phone]. Received confirmation.

## 2016-04-11 MED FILL — ZOLPIDEM TARTRATE 10 MG TAB: 10 | 30 days supply | Qty: 30 | Fill #5

## 2016-05-12 ENCOUNTER — Telehealth: Payer: Self-pay | Admitting: Neurology

## 2016-05-12 MED FILL — ZOLPIDEM TARTRATE 10 MG TAB: 10 | 30 days supply | Qty: 30 | Fill #0

## 2016-05-12 NOTE — Telephone Encounter (Signed)
Josh with [name] is calling to get a verbal order for Rx dalfampridine (AMPYRA) 10 MG TB12 for the patient.

## 2016-05-13 ENCOUNTER — Other Ambulatory Visit: Payer: Self-pay | Admitting: *Deleted

## 2016-05-13 ENCOUNTER — Encounter: Payer: Self-pay | Admitting: *Deleted

## 2016-05-13 DIAGNOSIS — G35 Multiple sclerosis: Secondary | ICD-10-CM

## 2016-05-13 MED ORDER — MODAFINIL 200 MG PO TABS: 200.0000 mg | ORAL_TABLET | Freq: Every day | ORAL | 5 refills | Status: DC | PRN

## 2016-05-13 MED ORDER — DALFAMPRIDINE ER 10 MG PO TB12: 10.0000 mg | ORAL_TABLET | Freq: Two times a day (BID) | ORAL | 3 refills | Status: DC

## 2016-05-13 MED FILL — MODAFINIL 200 MG TABLET: 200 | 30 days supply | Qty: 30 | Fill #0

## 2016-05-13 NOTE — Progress Notes (Signed)
Faxed printed/signed rx modafinil to pt pharmacy. Received fax request from [facility] outpt pharmacy. Fax: [phone]. Received confirmation.

## 2016-05-13 NOTE — Telephone Encounter (Signed)
Received fax request from [facility] for med refill on rx ampyra. Faxed printed/signed rx to [facility] at [phone]. Received confirmation.

## 2016-05-19 MED FILL — LOSARTAN POTASSIUM 100 MG T: 100 | 90 days supply | Qty: 90 | Fill #3

## 2016-06-05 ENCOUNTER — Encounter (INDEPENDENT_AMBULATORY_CARE_PROVIDER_SITE_OTHER): Payer: Self-pay

## 2016-06-05 ENCOUNTER — Encounter: Payer: Self-pay | Admitting: Neurology

## 2016-06-05 ENCOUNTER — Ambulatory Visit (INDEPENDENT_AMBULATORY_CARE_PROVIDER_SITE_OTHER): Payer: No Typology Code available for payment source | Admitting: Neurology

## 2016-06-05 VITALS — BP 128/85 | HR 83 | Ht 69.0 in | Wt 174.0 lb

## 2016-06-05 DIAGNOSIS — G35 Multiple sclerosis: Secondary | ICD-10-CM

## 2016-06-05 NOTE — Progress Notes (Signed)
Reason for visit: Multiple sclerosis  Antonio Cook is an 61 y.o. 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 He has reported some issues with weakness and fatigue of the left leg that have remained stable. He is exercising on a regular basis, at least twice a week. He has done well with this, he is on Rebif and he is tolerating this medication well. He takes Ampyra and he believes that this has been helpful for him. He denies any new symptoms since last seen. He claims that his primary care physician is getting annual blood work on him. He denies any new visual changes, or problems with bladder or bowel control. He does have some urinary urgency and frequency at times.  Past Medical History:  Diagnosis Date  . Hypertension   . MS (multiple sclerosis) (HCC)     Past Surgical History:  Procedure Laterality Date  . ACHILLES TENDON REPAIR Left     Family History  Problem Relation Age of Onset  . Lymphoma Mother   . Cancer Father   . Cancer Maternal Grandfather   . Multiple sclerosis Neg Hx     Social history:  reports that he has never smoked. He has never used smokeless tobacco. He reports that he does not drink alcohol or use drugs.    Allergies  Allergen Reactions  . Gadolinium Derivatives Nausea And Vomiting    Pt vomited after injection.   Marland Kitchen Hctz [Hydrochlorothiazide] Rash    Medications:  Prior to Admission medications   Medication Sig Start Date End Date Taking? Authorizing Provider  Cholecalciferol (VITAMIN D-1000 MAX ST) 1000 units tablet Take by mouth.   Yes Historical Provider, MD  dalfampridine (AMPYRA) 10 MG TB12 Take 1 tablet (10 mg total) by mouth 2 (two) times daily. 05/13/16  Yes York Spaniel, MD  interferon beta-1a (REBIF) 44 MCG/0.5ML SOSY injection Inject 0.5 mLs (44 mcg total) into the skin 3 (three) times a week. 10/03/15  Yes York Spaniel, MD  losartan (COZAAR) 100 MG  tablet Take 100 mg by mouth daily.   Yes Historical Provider, MD  Magnesium 250 MG TABS Take 250 mg by mouth.   Yes Historical Provider, MD  modafinil (PROVIGIL) 200 MG tablet Take 1 tablet (200 mg total) by mouth daily as needed. 05/13/16  Yes York Spaniel, MD  Ocrelizumab (OCREVUS IV) Inject 600 mg into the vein every 6 (six) months.   Yes Historical Provider, MD  ondansetron (ZOFRAN) 4 MG tablet Take 1 tablet (4 mg total) by mouth every 8 (eight) hours as needed for nausea or vomiting. 10/11/14  Yes York Spaniel, MD  zolpidem (AMBIEN) 10 MG tablet Take 1 tablet (10 mg total) by mouth at bedtime. 04/10/16  Yes York Spaniel, MD    ROS:  Out of a complete 14 system review of symptoms, the patient complains only of the following symptoms, and all other reviewed systems are negative.  Left leg fatigue  Blood pressure 128/85, pulse 83, height 5\' 9"  (1.753 m), weight 174 lb (78.9 kg).  Physical Exam  General: The patient is alert and cooperative at the time of the examination.  Skin: No significant peripheral edema is noted.   Neurologic Exam  Mental status: The patient is alert and oriented x 3 at the time of the examination. The patient has apparent normal recent and remote memory, with an apparently normal attention span and concentration  ability.   Cranial nerves: Facial symmetry is present. Speech is normal, no aphasia or dysarthria is noted. Extraocular movements are full. Visual fields are full. Pupils are equal, round, and reactive to light. Discs are flat bilaterally.  Motor: The patient has good strength in all 4 extremities.  Sensory examination: Soft touch sensation is symmetric on the face, arms, and legs.  Coordination: The patient has good finger-nose-finger and heel-to-shin bilaterally.  Gait and station: The patient has a normal gait. Tandem gait is normal. Romberg is negative. No drift is seen. The patient performed 6 laps of 25 feet, he was able to perform  this in 15 seconds.  Reflexes: Deep tendon reflexes are symmetric.   Assessment/Plan:  1. Multiple sclerosis  The patient is doing well at this time. He will continue Rebif, he is getting blood work done through his primary care physician. He will follow-up in 6 months, sooner if needed.  Marlan Palau MD 06/05/2016 3:46 PM  [facility] [address] [place], [place] [address]  Phone [phone] Fax [phone]

## 2016-06-10 MED FILL — ZOLPIDEM TARTRATE 10 MG TAB: 10 | 30 days supply | Qty: 30 | Fill #1

## 2016-06-23 ENCOUNTER — Telehealth: Payer: Self-pay | Admitting: Neurology

## 2016-06-23 NOTE — Telephone Encounter (Signed)
[facility] called stating the Prior Auth has expired , for  dalfampridine (AMPYRA) 10 MG TB12 please call [phone]

## 2016-06-23 NOTE — Telephone Encounter (Signed)
Pt has 4 days remaining of this medication

## 2016-06-24 NOTE — Telephone Encounter (Signed)
Ampyra PA initiated through CoverMyMeds.

## 2016-06-25 NOTE — Telephone Encounter (Signed)
PA approve for amprya thru 06/24/2017.

## 2016-07-11 MED FILL — ZOLPIDEM TARTRATE 10 MG TAB: 10 | 30 days supply | Qty: 30 | Fill #2

## 2016-07-17 MED FILL — MODAFINIL 200 MG TAB: 200 | 30 days supply | Qty: 30 | Fill #1

## 2016-08-11 MED FILL — ZOLPIDEM TARTRATE 10 MG TAB: 10 | 30 days supply | Qty: 30 | Fill #3

## 2016-08-18 MED FILL — LOSARTAN POTASSIUM 100 MG T: 100 | 90 days supply | Qty: 90 | Fill #4

## 2016-09-08 MED FILL — ZOLPIDEM TARTRATE 10 MG TAB: 10 | 30 days supply | Qty: 30 | Fill #4

## 2016-09-30 MED FILL — MODAFINIL 200 MG TAB: 200 | 30 days supply | Qty: 30 | Fill #2

## 2016-10-05 ENCOUNTER — Other Ambulatory Visit: Payer: Self-pay | Admitting: Neurology

## 2016-10-07 ENCOUNTER — Other Ambulatory Visit: Payer: Self-pay | Admitting: Neurology

## 2016-10-08 ENCOUNTER — Other Ambulatory Visit: Payer: Self-pay

## 2016-10-08 ENCOUNTER — Telehealth: Payer: Self-pay | Admitting: Neurology

## 2016-10-08 DIAGNOSIS — G35 Multiple sclerosis: Secondary | ICD-10-CM

## 2016-10-08 MED ORDER — ZOLPIDEM TARTRATE 10 MG PO TABS: 10.0000 mg | ORAL_TABLET | Freq: Every day | ORAL | 0 refills | Status: DC

## 2016-10-08 MED FILL — ZOLPIDEM TARTRATE 10 MG TAB: 10 | 30 days supply | Qty: 30 | Fill #0

## 2016-10-08 NOTE — Telephone Encounter (Signed)
Rn call [facility]. The pharmacy confirmed he was due for a refill. Refill done for 30 days until Dr. Anne Hahn comes back in the office. Refill done by Dr. Pearlean Brownie the work in am doctor.

## 2016-10-08 NOTE — Telephone Encounter (Signed)
Refill rx fax to [facility] to [phone].Fax confirmed.

## 2016-10-08 NOTE — Telephone Encounter (Signed)
Pt called request refill for zolpidem (AMBIEN) 10 MG tablet sent to [facility] outpt pharm. Pt said he is going out of town soon and would like this done today.

## 2016-10-26 ENCOUNTER — Encounter (HOSPITAL_COMMUNITY): Payer: Self-pay

## 2016-10-26 ENCOUNTER — Ambulatory Visit (HOSPITAL_COMMUNITY)
Admission: RE | Admit: 2016-10-26 | Discharge: 2016-10-26 | Disposition: A | Discharge status: Home / Self Care | Payer: No Typology Code available for payment source | Source: Ambulatory Visit | Attending: Internal Medicine | Admitting: Internal Medicine

## 2016-10-26 ENCOUNTER — Other Ambulatory Visit (HOSPITAL_COMMUNITY): Payer: Self-pay | Admitting: Internal Medicine

## 2016-10-26 DIAGNOSIS — R1084 Generalized abdominal pain: Secondary | ICD-10-CM | POA: Diagnosis present

## 2016-10-26 DIAGNOSIS — K8020 Calculus of gallbladder without cholecystitis without obstruction: Secondary | ICD-10-CM | POA: Diagnosis not present

## 2016-10-26 LAB — POCT I-STAT CREATININE: Creatinine, Ser: 1.1 mg/dL (ref 0.61–1.24)

## 2016-10-26 MED ORDER — IOPAMIDOL (ISOVUE-300) INJECTION 61%
100.0000 mL | Freq: Once | INTRAVENOUS | Status: AC | PRN
  Administered 2016-10-26: 100 mL via INTRAVENOUS

## 2016-10-26 MED ORDER — IOPAMIDOL (ISOVUE-300) INJECTION 61%
30.0000 mL | Freq: Once | INTRAVENOUS | Status: AC | PRN
Start: 2016-10-26 — End: 2016-10-26
  Administered 2016-10-26: 30 mL via ORAL

## 2016-11-03 ENCOUNTER — Telehealth: Payer: Self-pay | Admitting: *Deleted

## 2016-11-03 DIAGNOSIS — G35 Multiple sclerosis: Secondary | ICD-10-CM

## 2016-11-03 MED ORDER — ZOLPIDEM TARTRATE 10 MG PO TABS: 10.0000 mg | ORAL_TABLET | Freq: Every day | ORAL | 4 refills | Status: DC

## 2016-11-03 NOTE — Telephone Encounter (Signed)
Received fax from [facility] for request to refill ambien.

## 2016-11-03 NOTE — Telephone Encounter (Signed)
Faxed printed/signed rx ambien to [facility] at [phone]. Received fax confirmation.

## 2016-11-03 NOTE — Telephone Encounter (Signed)
Ambien will be refilled

## 2016-11-04 MED FILL — ZOLPIDEM TARTRATE 10 MG TAB: 10 | 30 days supply | Qty: 30 | Fill #0

## 2016-11-14 MED FILL — LOSARTAN POTASSIUM 100 MG T: 100 | 90 days supply | Qty: 90 | Fill #0

## 2016-11-17 ENCOUNTER — Telehealth: Payer: Self-pay | Admitting: Neurology

## 2016-11-17 MED ORDER — DALFAMPRIDINE ER 10 MG PO TB12: 10.0000 mg | ORAL_TABLET | Freq: Two times a day (BID) | ORAL | 3 refills | Status: DC

## 2016-11-17 NOTE — Telephone Encounter (Signed)
Apparently generic version of entire is now available, okay to switch to generic.  I called patient to let him know that I was sending in the prescription.

## 2016-11-17 NOTE — Telephone Encounter (Signed)
Antonio Cook with [name] is calling to see if generic AMPYRA 10 MG TB12 can be filled instead of brand name.

## 2016-11-17 NOTE — Addendum Note (Signed)
Addended by: York Spaniel on: 11/17/2016 05:14 PM   Modules accepted: Orders

## 2016-11-18 ENCOUNTER — Other Ambulatory Visit: Payer: Self-pay | Admitting: *Deleted

## 2016-11-18 MED ORDER — DALFAMPRIDINE ER 10 MG PO TB12: 10.0000 mg | ORAL_TABLET | Freq: Two times a day (BID) | ORAL | 3 refills | Status: DC

## 2016-11-18 NOTE — Progress Notes (Signed)
Called patient. He received Dr Anne Hahn' message yesterday and in agreement to try generic. Rx sent to [facility] originally. They do not carry medication. Re-sent to [facility] specialty pharmacy.

## 2016-11-26 ENCOUNTER — Telehealth: Payer: Self-pay | Admitting: *Deleted
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
## 2016-12-04 ENCOUNTER — Telehealth: Payer: Self-pay | Admitting: Neurology

## 2016-12-04 DIAGNOSIS — G35 Multiple sclerosis: Secondary | ICD-10-CM

## 2016-12-04 MED ORDER — MODAFINIL 200 MG PO TABS: 200.0000 mg | ORAL_TABLET | Freq: Every day | ORAL | 5 refills | Status: DC | PRN

## 2016-12-04 MED FILL — MODAFINIL 200 MG TABS: 200 | 30 days supply | Qty: 30 | Fill #0

## 2016-12-04 NOTE — Telephone Encounter (Signed)
Rx modafinil faxed to [facility] outpt pharmacy at [phone]. Received fax confirmation.

## 2016-12-04 NOTE — Telephone Encounter (Signed)
rx printed, awaiting MD signature

## 2016-12-04 NOTE — Telephone Encounter (Signed)
Pt request refill for modafinil (PROVIGIL) 200 MG tablet sent to [facility] outpt.

## 2016-12-05 MED FILL — ZOLPIDEM TARTRATE 10 MG TAB: 10 | 30 days supply | Qty: 30 | Fill #1

## 2016-12-18 ENCOUNTER — Ambulatory Visit (INDEPENDENT_AMBULATORY_CARE_PROVIDER_SITE_OTHER): Payer: No Typology Code available for payment source | Admitting: Neurology

## 2016-12-18 ENCOUNTER — Encounter (INDEPENDENT_AMBULATORY_CARE_PROVIDER_SITE_OTHER): Payer: Self-pay

## 2016-12-18 ENCOUNTER — Encounter: Payer: Self-pay | Admitting: Neurology

## 2016-12-18 VITALS — BP 121/79 | HR 86 | Ht 69.0 in | Wt 171.5 lb

## 2016-12-18 DIAGNOSIS — G35 Multiple sclerosis: Secondary | ICD-10-CM | POA: Diagnosis not present

## 2016-12-18 NOTE — Progress Notes (Signed)
Reason for visit: Multiple sclerosis  Antonio Cook is an 61 y.o. 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  The patient has had some reports of mild left leg weakness, he feels that this has improved over time.  He takes Ampyra and Provigil, he is on the Rebif and he is tolerating the medication well.  The patient has not had any exacerbations in his clinical symptomatology since last seen.  He may have some good days and bad days with walking.  Overall, he feels that he is doing well.  He gets annual blood work done through his primary care physician.   Past Medical History:  Diagnosis Date  . Hypertension   . MS (multiple sclerosis) (HCC)     Past Surgical History:  Procedure Laterality Date  . ACHILLES TENDON REPAIR Left     Family History  Problem Relation Age of Onset  . Lymphoma Mother   . Cancer Father   . Cancer Maternal Grandfather   . Multiple sclerosis Neg Hx     Social history:  reports that  has never smoked. he has never used smokeless tobacco. He reports that he does not drink alcohol or use drugs.    Allergies  Allergen Reactions  . Gadolinium Derivatives Nausea And Vomiting    Pt vomited after injection.   Marland Kitchen. Hctz [Hydrochlorothiazide] Rash    Medications:  Prior to Admission medications   Medication Sig Start Date End Date Taking? Authorizing Provider  Cholecalciferol (VITAMIN D-1000 MAX ST) 1000 units tablet Take by mouth.   Yes [provider]  dalfampridine (AMPYRA) 10 MG TB12 Take 1 tablet (10 mg total) by mouth 2 (two) times daily. 11/18/16  Yes York SpanielWillis, Analeia Ismael K, MD  losartan (COZAAR) 100 MG tablet Take 100 mg by mouth daily.   Yes [provider]  Magnesium 250 MG TABS Take 250 mg by mouth.   Yes [provider]  modafinil (PROVIGIL) 200 MG tablet Take 1 tablet (200 mg total) by mouth daily as needed. 12/04/16  Yes York SpanielWillis, Meade Hogeland  K, MD  Ocrelizumab (OCREVUS IV) Inject 600 mg into the vein every 6 (six) months.   Yes [provider]  ondansetron (ZOFRAN) 4 MG tablet Take 1 tablet (4 mg total) by mouth every 8 (eight) hours as needed for nausea or vomiting. 10/11/14  Yes York SpanielWillis, Shad Ledvina K, MD  REBIF 44 MCG/0.5ML SOSY injection INJECT 0.5 MLS (44 MCG TOTAL) INTO THE SKIN 3 (THREE) TIMES A WEEK. 10/08/16  Yes York SpanielWillis, Stepen Prins K, MD  zolpidem (AMBIEN) 10 MG tablet Take 1 tablet (10 mg total) by mouth at bedtime. 11/03/16  Yes York SpanielWillis, Marvina Danner K, MD    ROS:  Out of a complete 14 system review of symptoms, the patient complains only of the following symptoms, and all other reviewed systems are negative.  Left leg weakness   Blood pressure 121/79, pulse 86, height 5\' 9"  (1.753 m), weight 171 lb 8 oz (77.8 kg).  Physical Exam  General: The patient is alert and cooperative at the time of the examination.  Skin: No significant peripheral edema is noted.   Neurologic Exam  Mental status: The patient is alert and oriented x 3 at the time of the examination. The patient has apparent normal recent and remote memory, with an apparently normal attention span and concentration ability.   Cranial nerves: Facial symmetry is present. Speech is normal, no aphasia  or dysarthria is noted. Extraocular movements are full. Visual fields are full.  Pupils are equal, round, and reactive to light.  Discs are flat bilaterally.  Motor: The patient has good strength in all 4 extremities.  Sensory examination: Soft touch sensation is symmetric on the face, arms, and legs.  Coordination: The patient has good finger-nose-finger and heel-to-shin bilaterally.  Gait and station: The patient has a normal gait. Tandem gait is normal. Romberg is negative. No drift is seen.  Reflexes: Deep tendon reflexes are symmetric.   Assessment/Plan:  1.  Multiple sclerosis  The patient will continue his Rebif, Ambien, Provigil, and Ampyra.  He  will follow-up in 1 year, sooner if needed.  He will call if he has any new symptoms.  Marlan Palau MD 12/18/2016 3:36 PM  [facility] [address] [place], [place] [address]  Phone [phone] Fax [phone]

## 2017-01-05 MED FILL — ZOLPIDEM TARTRATE 10 MG TAB: 10 | 30 days supply | Qty: 30 | Fill #2

## 2017-01-13 ENCOUNTER — Ambulatory Visit: Payer: No Typology Code available for payment source | Admitting: Psychology

## 2017-01-21 ENCOUNTER — Other Ambulatory Visit: Payer: Self-pay | Admitting: Internal Medicine

## 2017-01-21 DIAGNOSIS — R350 Frequency of micturition: Secondary | ICD-10-CM

## 2017-02-04 MED FILL — MODAFINIL 200 MG TABLET: 200 | 30 days supply | Qty: 30 | Fill #1

## 2017-02-04 MED FILL — ZOLPIDEM TARTRATE 10 MG TAB: 10 | 30 days supply | Qty: 30 | Fill #3

## 2017-02-11 MED FILL — LOSARTAN POTASSIUM 100 MG T: 100 | 90 days supply | Qty: 90 | Fill #1

## 2017-02-19 ENCOUNTER — Ambulatory Visit
Admission: RE | Admit: 2017-02-19 | Discharge: 2017-02-19 | Disposition: A | Discharge status: Home / Self Care | Payer: No Typology Code available for payment source | Source: Ambulatory Visit | Attending: Internal Medicine | Admitting: Internal Medicine

## 2017-02-19 DIAGNOSIS — R350 Frequency of micturition: Secondary | ICD-10-CM

## 2017-03-04 MED FILL — ZOLPIDEM TARTRATE 10 MG TAB: 10 | 30 days supply | Qty: 30 | Fill #4

## 2017-03-04 MED FILL — MODAFINIL 200 MG TABLET: 200 | 30 days supply | Qty: 30 | Fill #2

## 2017-04-01 ENCOUNTER — Telehealth: Payer: Self-pay | Admitting: Neurology

## 2017-04-01 DIAGNOSIS — G35 Multiple sclerosis: Secondary | ICD-10-CM

## 2017-04-01 MED ORDER — ZOLPIDEM TARTRATE 10 MG PO TABS: 10.0000 mg | ORAL_TABLET | Freq: Every day | ORAL | 1 refills | Status: DC

## 2017-04-01 NOTE — Telephone Encounter (Signed)
I will send in a 90-day prescription.

## 2017-04-01 NOTE — Telephone Encounter (Signed)
Patient called back. I took call from phone room. He is wondering if he can get a 90 day supply of his Remus Loffler so he does not have to go to the pharmacy every month for a refill. Advised I will send his request to Dr. Anne Hahn.  He verbalized understanding.

## 2017-04-01 NOTE — Telephone Encounter (Signed)
Pt is asking for a call from RN Kara Mead to discuss his Ambien

## 2017-04-01 NOTE — Addendum Note (Signed)
Addended by: York Spaniel on: 04/01/2017 05:09 PM   Modules accepted: Orders

## 2017-04-01 NOTE — Telephone Encounter (Signed)
Returned pt call, LVM for him to call back.

## 2017-04-02 MED FILL — ZOLPIDEM TARTRATE 10 MG TAB: 10 | 90 days supply | Qty: 90 | Fill #0

## 2017-05-11 MED FILL — LOSARTAN POTASSIUM 100 MG T: 100 | 90 days supply | Qty: 90 | Fill #2

## 2017-05-18 MED FILL — TAMSULOSIN HCL 0.4 MG CAP: 0.4 | 90 days supply | Qty: 180 | Fill #0

## 2017-05-28 MED FILL — MODAFINIL 200 MG TABLET: 200 | 30 days supply | Qty: 30 | Fill #3

## 2017-07-02 MED FILL — ZOLPIDEM TARTRATE 10 MG TAB: 10 | 90 days supply | Qty: 90 | Fill #1

## 2017-07-15 ENCOUNTER — Other Ambulatory Visit: Payer: Self-pay | Admitting: *Deleted

## 2017-07-15 ENCOUNTER — Telehealth: Payer: Self-pay | Admitting: Neurology

## 2017-07-15 DIAGNOSIS — G35 Multiple sclerosis: Secondary | ICD-10-CM

## 2017-07-15 MED ORDER — MODAFINIL 200 MG PO TABS: 200.0000 mg | ORAL_TABLET | Freq: Every day | ORAL | 5 refills | Status: DC | PRN

## 2017-07-15 MED FILL — MODAFINIL 200 MG TABLET: 200 | 30 days supply | Qty: 30 | Fill #0

## 2017-07-15 NOTE — Telephone Encounter (Signed)
Patient requesting a returned call from Dr. Anne Hahn at his convenience, non urgent.. Would not go into detail.

## 2017-07-15 NOTE — Telephone Encounter (Signed)
I called the patient.  He indicates that he has had a consultation recently done through [facility], I do not see this in the computer.  They suggested that he get an MRI of the brain and consider stopping his Rebif.  If he does this, we should follow MRI evaluations annually for several years.  I will go ahead and get an MRI of the brain.

## 2017-07-15 NOTE — Addendum Note (Signed)
Addended by: York Spaniel on: 07/15/2017 04:43 PM   Modules accepted: Orders

## 2017-07-16 ENCOUNTER — Telehealth: Payer: Self-pay | Admitting: Neurology

## 2017-07-16 NOTE — Telephone Encounter (Signed)
[employer] order sent to [facility]. They will obtain the auth for the patient and will contact the pt to schedule.

## 2017-07-20 ENCOUNTER — Telehealth: Payer: Self-pay | Admitting: *Deleted

## 2017-07-20 NOTE — Telephone Encounter (Signed)
Called [facility]. Advised we received PA request for Ampyra 10mg  ER. Back in October Dr. Anne Hahn prescribed new rx stating ok to fill generically. They stated pt has been filling it brand name. Pt requested this. He tried running it both generically and brand and both require a PA. In process of completing this.

## 2017-07-21 NOTE — Telephone Encounter (Signed)
Faxed completed/signed PA form to [facility] at [phone]. Received fax confirmation. Waiting on determination.

## 2017-07-22 NOTE — Telephone Encounter (Signed)
Received fax notification from [facility] that PA Ampyra tab 10mg  approved through 07/22/2018. Reference#: [id number].   Faxed notice of approval to [facility] at [phone]. Received fax confirmation.

## 2017-07-28 ENCOUNTER — Telehealth: Payer: Self-pay | Admitting: Neurology

## 2017-07-28 MED ORDER — ALPRAZOLAM 0.5 MG PO TABS: ORAL_TABLET | ORAL | 0 refills | Status: DC

## 2017-07-28 MED FILL — ALPRAZolam 0.5 MG TABS: 0.5 | 1 days supply | Qty: 3 | Fill #0

## 2017-07-28 NOTE — Telephone Encounter (Signed)
I will send in a prescription for the alprazolam.

## 2017-07-28 NOTE — Telephone Encounter (Signed)
Pt called to advise he is claustrophobic and requests xanax 0.5mg  qty 5 sent to [facility] outpt Pharm for him to take prior to imaging study. He is scheduled 08/04/17.

## 2017-07-29 ENCOUNTER — Telehealth: Payer: Self-pay | Admitting: *Deleted

## 2017-07-29 NOTE — Telephone Encounter (Signed)
Called and LVM for pt letting him know we received fax notification from [facility] that he declined consent for Ampyra 10mg  to be shipped. Provided him their number: [phone] if done in error.  If he did decline, I asked him to call back to further discuss.

## 2017-08-03 MED FILL — LOSARTAN POTASSIUM 100 MG T: 100 | 90 days supply | Qty: 90 | Fill #3

## 2017-08-03 MED FILL — CYCLOBENZAPRINE 10 MG TAB: 10 | 10 days supply | Qty: 30 | Fill #0

## 2017-08-04 ENCOUNTER — Ambulatory Visit
Admission: RE | Admit: 2017-08-04 | Discharge: 2017-08-04 | Disposition: A | Discharge status: Home / Self Care | Payer: No Typology Code available for payment source | Source: Ambulatory Visit | Attending: Neurology | Admitting: Neurology

## 2017-08-04 DIAGNOSIS — G35 Multiple sclerosis: Secondary | ICD-10-CM | POA: Diagnosis not present

## 2017-08-06 ENCOUNTER — Telehealth: Payer: Self-pay | Admitting: Neurology

## 2017-08-06 NOTE — Telephone Encounter (Signed)
I called the patient.  MRI of the brain is unchanged from 2016.  I discussed this with him.  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  - No acute findings. - Compared to MRI on 10/24/14, no significant change.

## 2017-08-17 MED FILL — TAMSULOSIN HCL 0.4 MG CAP: 0.4 | 90 days supply | Qty: 180 | Fill #0

## 2017-09-17 ENCOUNTER — Telehealth: Payer: Self-pay | Admitting: *Deleted

## 2017-09-17 NOTE — Telephone Encounter (Signed)
Faxed notice of approval to [facility]pt pharmacy at [phone]. Received fax confirmation.

## 2017-09-17 NOTE — Telephone Encounter (Signed)
Submitted PA modafinil 200mg  tab on covermymeds. Key: [id number]. Waiting on determination.   Request Reference Number: [id number]. MODAFINIL TAB 200MG  is approved through 03/20/2018. For further questions, call [phone].

## 2017-09-18 MED FILL — MODAFINIL 200 MG TABLET: 200 | 30 days supply | Qty: 30 | Fill #1

## 2017-09-30 ENCOUNTER — Other Ambulatory Visit: Payer: Self-pay | Admitting: Neurology

## 2017-09-30 DIAGNOSIS — G35 Multiple sclerosis: Secondary | ICD-10-CM

## 2017-09-30 MED ORDER — ZOLPIDEM TARTRATE 10 MG PO TABS: 10.0000 mg | ORAL_TABLET | Freq: Every day | ORAL | 1 refills | Status: DC

## 2017-09-30 MED FILL — ZOLPIDEM TARTRATE 10 MG TAB: 10 | 90 days supply | Qty: 90 | Fill #0

## 2017-09-30 NOTE — Telephone Encounter (Signed)
Pt requesting refills for zolpidem (AMBIEN) 10 MG tablet sent to [facility] outpt pharmacy

## 2017-11-03 MED FILL — MODAFINIL 200 MG TABLET: 200 | 30 days supply | Qty: 30 | Fill #2

## 2017-11-03 MED FILL — LOSARTAN POTASSIUM 100 MG T: 100 | 30 days supply | Qty: 30 | Fill #4

## 2017-11-19 MED FILL — TAMSULOSIN HCL 0.4 MG CAP: 0.4 | 90 days supply | Qty: 180 | Fill #1

## 2017-11-26 MED FILL — LOSARTAN POTASSIUM 100 MG T: 100 | 30 days supply | Qty: 30 | Fill #0

## 2017-12-23 ENCOUNTER — Encounter: Payer: Self-pay | Admitting: Neurology

## 2017-12-23 ENCOUNTER — Ambulatory Visit (INDEPENDENT_AMBULATORY_CARE_PROVIDER_SITE_OTHER): Payer: No Typology Code available for payment source | Admitting: Neurology

## 2017-12-23 ENCOUNTER — Other Ambulatory Visit: Payer: Self-pay

## 2017-12-23 VITALS — BP 130/80 | HR 92 | Resp 16 | Ht 69.0 in | Wt 172.0 lb

## 2017-12-23 DIAGNOSIS — G35 Multiple sclerosis: Secondary | ICD-10-CM

## 2017-12-23 MED ORDER — ZOLPIDEM TARTRATE 10 MG PO TABS: 10.0000 mg | ORAL_TABLET | Freq: Every day | ORAL | 1 refills | Status: DC

## 2017-12-23 MED ORDER — ALPRAZOLAM 0.5 MG PO TABS: 0.5000 mg | ORAL_TABLET | Freq: Three times a day (TID) | ORAL | 0 refills | Status: DC | PRN

## 2017-12-23 MED FILL — ZOLPIDEM TARTRATE 10 MG TAB: 10 | 90 days supply | Qty: 90 | Fill #0

## 2017-12-23 MED FILL — ALPRAZolam 0.5 MG TABS: 0.5 | 10 days supply | Qty: 30 | Fill #0

## 2017-12-23 NOTE — Progress Notes (Signed)
Reason for visit: Multiple sclerosis  Antonio Cook is an 62 y.o. 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  The patient had been on Rebif, this was stopped, he is now on high-dose biotin taking 100 mg 3 times daily.  The patient is off of Ampyra.  He uses Provigil which seems to help his fatigue.  He last had MRI of the brain done in June 2019 which was unchanged from 2016.  The patient uses magnesium sulfate which helps his leg cramps.  The patient does have some urinary urgency and frequency at times.  The patient denies any new symptoms of numbness, weakness, vision changes, or changes in balance.  He did have an event of vertigo 7 weeks ago that lasted about 24 hours and then stopped.  It was associated with nausea and vomiting.  He was using meclizine for this event.  Past Medical History:  Diagnosis Date  . Hypertension   . MS (multiple sclerosis) (HCC)     Past Surgical History:  Procedure Laterality Date  . ACHILLES TENDON REPAIR Left     Family History  Problem Relation Age of Onset  . Lymphoma Mother   . Cancer Father   . Cancer Maternal Grandfather   . Multiple sclerosis Neg Hx     Social history:  reports that he has never smoked. He has never used smokeless tobacco. He reports that he does not drink alcohol or use drugs.    Allergies  Allergen Reactions  . Gadolinium Derivatives Nausea And Vomiting    Pt vomited after injection.   Marland Kitchen Hctz [Hydrochlorothiazide] Rash    Medications:  Prior to Admission medications   Medication Sig Start Date End Date Taking? Authorizing Provider  ALPRAZolam Prudy Feeler) 0.5 MG tablet Take 2 tablets approximately 45 minutes prior to the MRI study, take a third tablet if needed. 07/28/17  Yes York Spaniel, MD  Cholecalciferol (VITAMIN D-1000 MAX ST) 1000 units tablet Take by mouth.   Yes [provider]  dalfampridine (AMPYRA) 10 MG  TB12 Take 1 tablet (10 mg total) by mouth 2 (two) times daily. 11/18/16  Yes York Spaniel, MD  losartan (COZAAR) 100 MG tablet Take 100 mg by mouth daily.   Yes [provider]  Magnesium 250 MG TABS Take 250 mg by mouth.   Yes [provider]  modafinil (PROVIGIL) 200 MG tablet Take 1 tablet (200 mg total) by mouth daily as needed. 07/15/17  Yes York Spaniel, MD  ondansetron (ZOFRAN) 4 MG tablet Take 1 tablet (4 mg total) by mouth every 8 (eight) hours as needed for nausea or vomiting. 10/11/14  Yes York Spaniel, MD  zolpidem (AMBIEN) 10 MG tablet Take 1 tablet (10 mg total) by mouth at bedtime. 09/30/17  Yes York Spaniel, MD    ROS:  Out of a complete 14 system review of symptoms, the patient complains only of the following symptoms, and all other reviewed systems are negative.  Fatigue  Blood pressure 130/80, pulse 92, resp. rate 16, height 5\' 9"  (1.753 m), weight 172 lb (78 kg).  Physical Exam  General: The patient is alert and cooperative at the time of the examination.  Skin: No significant peripheral edema is noted.   Neurologic Exam  Mental status: The patient is alert and oriented x 3 at the time of the examination. The patient has apparent normal recent and  remote memory, with an apparently normal attention span and concentration ability.   Cranial nerves: Facial symmetry is present. Speech is normal, no aphasia or dysarthria is noted. Extraocular movements are full. Visual fields are full.  Motor: The patient has good strength in all 4 extremities.  Sensory examination: Soft touch sensation is symmetric on the face, arms, and legs.  Coordination: The patient has good finger-nose-finger and heel-to-shin bilaterally.  Gait and station: The patient has a normal gait. Tandem gait is normal. Romberg is negative. No drift is seen.  Reflexes: Deep tendon reflexes are symmetric.   MRI brain 08/06/17:  IMPRESSION:   Abnormal MRI brain  (without) demonstrating: - Multiple round and ovoid chronic demyelinating plaques.  - No acute findings. - Compared to MRI on 10/24/14, no significant change.   * MRI scan images were reviewed online. I agree with the written report.    Assessment/Plan:  1.  Multiple sclerosis  The patient believes that the left leg weakness has improved on the biotin.  The patient was given a prescription for alprazolam to take if needed and Ambien to take if needed for sleep.  He will follow-up in 1 year, sooner if needed.  We will recheck MRI of the brain in June 2020.  Marlan Palau MD 12/23/2017 3:40 PM  [facility] [address] [place], [place] [address]  Phone [phone] Fax [phone]

## 2017-12-28 MED FILL — MODAFINIL 200 MG TABLET: 200 | 30 days supply | Qty: 30 | Fill #3

## 2018-01-04 MED FILL — LOSARTAN POTASSIUM 100 MG T: 100 | 30 days supply | Qty: 30 | Fill #1

## 2018-01-27 MED FILL — LOSARTAN POTASSIUM 100 MG T: 100 | 30 days supply | Qty: 30 | Fill #2

## 2018-02-16 ENCOUNTER — Other Ambulatory Visit: Payer: Self-pay | Admitting: Neurology

## 2018-02-16 ENCOUNTER — Telehealth: Payer: Self-pay | Admitting: Neurology

## 2018-02-16 DIAGNOSIS — G35 Multiple sclerosis: Secondary | ICD-10-CM

## 2018-02-16 MED ORDER — MODAFINIL 200 MG PO TABS: 200.0000 mg | ORAL_TABLET | Freq: Every day | ORAL | 1 refills | Status: DC

## 2018-02-16 MED FILL — TAMSULOSIN HCL 0.4 MG CAP: 0.4 | 90 days supply | Qty: 180 | Fill #2

## 2018-02-16 MED FILL — MODAFINIL 200 MG TABLET: 200 | 90 days supply | Qty: 90 | Fill #0

## 2018-02-16 NOTE — Telephone Encounter (Signed)
[facility] outpt pharm requesting refills for   modafinil (PROVIGIL) 200 MG tablet

## 2018-02-16 NOTE — Addendum Note (Signed)
Addended by: York Spaniel on: 02/16/2018 05:29 PM   Modules accepted: Orders

## 2018-02-16 NOTE — Telephone Encounter (Signed)
Pt requesting a refill of Provigil 200 mg. Drug registry verified. Last refill was written on 12/28/17 # 30 for a 30 day supply.  Last o/v was 12/23/2017 next o/v 01/20/2019. MB RN

## 2018-02-16 NOTE — Telephone Encounter (Signed)
The prescription for Provigil was sent in.

## 2018-02-17 NOTE — Telephone Encounter (Signed)
Rx faxed to [facility] fax # [phone]. Confirmation received. MB RN

## 2018-03-05 MED FILL — LOSARTAN POTASSIUM 100 MG T: 100 | 30 days supply | Qty: 30 | Fill #3

## 2018-03-16 ENCOUNTER — Telehealth: Payer: Self-pay

## 2018-03-16 NOTE — Telephone Encounter (Signed)
PA for Modfafinil submitted via [facility]. F # [id number]. Confirmation received. Was unable to start PA through cover my meds.

## 2018-03-18 ENCOUNTER — Encounter: Payer: Self-pay | Admitting: Neurology

## 2018-03-18 ENCOUNTER — Ambulatory Visit (INDEPENDENT_AMBULATORY_CARE_PROVIDER_SITE_OTHER): Payer: No Typology Code available for payment source | Admitting: Neurology

## 2018-03-18 VITALS — BP 127/78 | HR 83 | Ht 69.0 in | Wt 173.0 lb

## 2018-03-18 DIAGNOSIS — M545 Low back pain, unspecified: Secondary | ICD-10-CM

## 2018-03-18 DIAGNOSIS — G8929 Other chronic pain: Secondary | ICD-10-CM

## 2018-03-18 NOTE — Progress Notes (Signed)
Reason for visit: Multiple sclerosis, low back pain  Antonio BalsamGerald I Cook is an 63 y.o. male  History of present illness:  Antonio Cook is a 63 year old right-handed white male with a history of multiple sclerosis.  He currently is not on any disease modifying agents.  He comes in today with a new problem.  He indicates that several months ago he bent over to pick up something and developed pain in his low back on the left side.  This seemed to get better with anti-inflammatory medications and rest.  The patient has continued to reinjure the back by lifting objects, it got worse about 6 weeks ago, he has been on Lodine daily which has helped, he is better in the afternoon, worse in the morning.  The patient denies any weakness or numbness of the extremities, he has not had any change in balance.  He comes to the office today for an evaluation.  Past Medical History:  Diagnosis Date  . Hypertension   . MS (multiple sclerosis) (HCC)     Past Surgical History:  Procedure Laterality Date  . ACHILLES TENDON REPAIR Left     Family History  Problem Relation Age of Onset  . Lymphoma Mother   . Cancer Father   . Cancer Maternal Grandfather   . Multiple sclerosis Neg Hx     Social history:  reports that he has never smoked. He has never used smokeless tobacco. He reports that he does not drink alcohol or use drugs.    Allergies  Allergen Reactions  . Gadolinium Derivatives Nausea And Vomiting    Pt vomited after injection.   Marland Kitchen. Hctz [Hydrochlorothiazide] Rash    Medications:  Prior to Admission medications   Medication Sig Start Date End Date Taking? Authorizing Provider  ALPRAZolam Prudy Feeler(XANAX) 0.5 MG tablet Take 1 tablet (0.5 mg total) by mouth 3 (three) times daily as needed for anxiety. 12/23/17  Yes York SpanielWillis, Charles K, MD  Cholecalciferol (VITAMIN D-1000 MAX ST) 1000 units tablet Take by mouth.   Yes [provider]  dalfampridine (AMPYRA) 10 MG TB12 Take 1 tablet (10 mg  total) by mouth 2 (two) times daily. 11/18/16  Yes York SpanielWillis, Charles K, MD  losartan (COZAAR) 100 MG tablet Take 100 mg by mouth daily.   Yes [provider]  Magnesium 250 MG TABS Take 250 mg by mouth.   Yes [provider]  modafinil (PROVIGIL) 200 MG tablet TAKE 1 TABLET BY MOUTH DAILY AS NEEDED. 02/16/18  Yes York SpanielWillis, Charles K, MD  modafinil (PROVIGIL) 200 MG tablet Take 1 tablet (200 mg total) by mouth daily. 02/16/18  Yes York SpanielWillis, Charles K, MD  ondansetron (ZOFRAN) 4 MG tablet Take 1 tablet (4 mg total) by mouth every 8 (eight) hours as needed for nausea or vomiting. 10/11/14  Yes York SpanielWillis, Charles K, MD  zolpidem (AMBIEN) 10 MG tablet Take 1 tablet (10 mg total) by mouth at bedtime. 12/23/17  Yes York SpanielWillis, Charles K, MD    ROS:  Out of a complete 14 system review of symptoms, the patient complains only of the following symptoms, and all other reviewed systems are negative.  Back pain  Blood pressure 127/78, pulse 83, height 5\' 9"  (1.753 m), weight 173 lb (78.5 kg).  Physical Exam  General: The patient is alert and cooperative at the time of the examination.  Skin: No significant peripheral edema is noted.   Neurologic Exam  Mental status: The patient is alert and oriented x 3 at the  time of the examination. The patient has apparent normal recent and remote memory, with an apparently normal attention span and concentration ability.   Cranial nerves: Facial symmetry is present. Speech is normal, no aphasia or dysarthria is noted. Extraocular movements are full. Visual fields are full.  Motor: The patient has good strength in all 4 extremities.  Sensory examination: Soft touch sensation is symmetric on the face, arms, and legs.  Coordination: The patient has good finger-nose-finger and heel-to-shin bilaterally.  Gait and station: The patient has a normal gait. Tandem gait is normal. Romberg is negative. No drift is seen.  Reflexes: Deep tendon reflexes are  symmetric.   Assessment/Plan:  1.  Low back pain, left leg pain  2.  Multiple sclerosis  The patient likely has facet joint arthritis, MRI done in 2015 shows some disease at the L4-5 level bilaterally.  The patient is having some pain into the left hip and then at the left knee.  Anti-inflammatory medications do seem to help.  The patient will continue this treatment, we will check an x-ray of the low back, if the pain continues we will consider a facet joint injection.  We will plan on doing an MRI of the brain sometime in June or July 2020.   Marlan Palau. Keith Willis MD 03/18/2018 2:28 PM  [facility] [address] [place], [place] [address]  Phone [phone] Fax [phone]

## 2018-03-18 NOTE — Telephone Encounter (Signed)
PA for Modafinil approved.  Approval dates effective : 03/18/18-09/16/18 PA ref # [id number]

## 2018-03-25 MED FILL — ZOLPIDEM TARTRATE 10 MG TAB: 10 | 90 days supply | Qty: 90 | Fill #1

## 2018-04-05 MED FILL — LOSARTAN POTASSIUM 100 MG T: 100 | 30 days supply | Qty: 30 | Fill #4

## 2018-04-06 ENCOUNTER — Telehealth: Payer: Self-pay

## 2018-04-06 ENCOUNTER — Ambulatory Visit
Admission: RE | Admit: 2018-04-06 | Discharge: 2018-04-06 | Disposition: A | Discharge status: Home / Self Care | Payer: No Typology Code available for payment source | Source: Ambulatory Visit | Attending: Neurology | Admitting: Neurology

## 2018-04-06 DIAGNOSIS — G8929 Other chronic pain: Secondary | ICD-10-CM

## 2018-04-06 DIAGNOSIS — M545 Low back pain, unspecified: Secondary | ICD-10-CM

## 2018-04-06 NOTE — Telephone Encounter (Signed)
Office staff came to me and asked me to take a call from Dr. Donell Beers since Aundra Millet, RN and Dr. Anne Hahn were unavailable. Pt is at [facility] for his xrays. [facility] needed permission from [place] to perform pt's xrays. I checked the order, it appears that Dr. Anne Hahn did note that pt's preferred imaging location was [facility] and they do have access to pt's xray orders. Zella Ball, at [facility] confirmed that she can see the orders and they can do pt's xrays, nothing further was needed from [place]. Pt reports that he thought he needed to go to [facility] for the xray, but he wants [facility] to perform the xrays. Nothing further is needed from [place].

## 2018-04-07 ENCOUNTER — Telehealth: Payer: Self-pay | Admitting: Neurology

## 2018-04-07 NOTE — Telephone Encounter (Signed)
I called the patient.  The x-ray shows mild degenerative changes, if the pain gets severe, we may try a facet joint injection on the left at the L4-5 level, this was the most severe level suggested by MRI done in 2015.   XR lumbar 04/07/18:  IMPRESSION: Degenerative change without acute abnormality.

## 2018-04-15 ENCOUNTER — Telehealth: Payer: Self-pay | Admitting: Neurology

## 2018-04-15 NOTE — Telephone Encounter (Signed)
I contacted the pt. He states he is requesting the X-Ray result  from 04/07/18 to be faxed to his secure fax [phone]) and to Dr. Dorma Russell ( [phone]) I have faxed report to both and received confirmation.

## 2018-04-15 NOTE — Telephone Encounter (Signed)
Pt is wanting MRI report sent to his PCP/Dr Nila Nephew and report faxed to him at [phone]

## 2018-05-04 MED FILL — LOSARTAN POTASSIUM 100 MG T: 100 | 90 days supply | Qty: 90 | Fill #5

## 2018-05-19 MED FILL — TAMSULOSIN HCL 0.4 MG CAP: 0.4 | 90 days supply | Qty: 180 | Fill #3

## 2018-06-22 ENCOUNTER — Other Ambulatory Visit: Payer: Self-pay | Admitting: Neurology

## 2018-06-22 DIAGNOSIS — G35 Multiple sclerosis: Secondary | ICD-10-CM

## 2018-06-23 MED FILL — ZOLPIDEM TARTRATE 10 MG TAB: 10 | 90 days supply | Qty: 90 | Fill #0

## 2018-08-02 MED FILL — MODAFINIL 200 MG TABS: 200 | 90 days supply | Qty: 90 | Fill #1

## 2018-08-02 MED FILL — LOSARTAN POTASSIUM 100 MG T: 100 | 90 days supply | Qty: 90 | Fill #6

## 2018-08-03 ENCOUNTER — Telehealth: Payer: Self-pay | Admitting: Neurology

## 2018-08-03 DIAGNOSIS — G35 Multiple sclerosis: Secondary | ICD-10-CM

## 2018-08-03 NOTE — Telephone Encounter (Signed)
I called the patient.  We discussed getting a repeat MRI of the brain off of disease modifying agents.  We will compare the study to what was done last year.  I will get the study set up.

## 2018-08-04 ENCOUNTER — Telehealth: Payer: Self-pay | Admitting: Neurology

## 2018-08-04 NOTE — Telephone Encounter (Signed)
[employer] order sent to [facility]. They will obtain the auth and reach out to the patient to schedule.  

## 2018-08-12 MED FILL — TAMSULOSIN HCL 0.4 MG CAP: 0.4 | 90 days supply | Qty: 180 | Fill #4

## 2018-09-11 ENCOUNTER — Other Ambulatory Visit: Payer: Self-pay

## 2018-09-11 ENCOUNTER — Ambulatory Visit
Admission: RE | Admit: 2018-09-11 | Discharge: 2018-09-11 | Disposition: A | Discharge status: Home / Self Care | Payer: No Typology Code available for payment source | Source: Ambulatory Visit | Attending: Neurology | Admitting: Neurology

## 2018-09-11 DIAGNOSIS — G35 Multiple sclerosis: Secondary | ICD-10-CM

## 2018-09-13 ENCOUNTER — Telehealth: Payer: Self-pay | Admitting: Neurology

## 2018-09-13 NOTE — Telephone Encounter (Signed)
I called the patient.  MRI of the brain appears to be stable from 1 year ago off of disease modifying agents.  We will remain off medication for now.    MRI brain 09/12/18:  IMPRESSION:   MRI brain (without) demonstrating: - Multiple round and ovoid, periventricular, subcortical, juxtacortical, callosal and left midbrain chronic demyelinating plaques. - No acute findings. No change from MRI on 08/04/17.

## 2018-09-21 MED FILL — ZOLPIDEM TARTRATE 10 MG TAB: 10 | 90 days supply | Qty: 90 | Fill #1

## 2018-11-01 MED FILL — LOSARTAN POTASSIUM 100 MG T: 100 | 30 days supply | Qty: 30 | Fill #7

## 2018-11-15 MED FILL — TAMSULOSIN HCL 0.4 MG CAP: 0.4 | 90 days supply | Qty: 90 | Fill #0

## 2018-12-02 MED FILL — LOSARTAN POTASSIUM 100 MG T: 100 | 30 days supply | Qty: 30 | Fill #0

## 2018-12-15 ENCOUNTER — Other Ambulatory Visit: Payer: Self-pay | Admitting: Neurology

## 2018-12-15 DIAGNOSIS — G35 Multiple sclerosis: Secondary | ICD-10-CM

## 2019-01-01 MED FILL — LOSARTAN POTASSIUM 100 MG T: 100 | 30 days supply | Qty: 30 | Fill #1

## 2019-01-01 MED FILL — TAMSULOSIN HCL 0.4 MG CAP: 0.4 | 90 days supply | Qty: 90 | Fill #1

## 2019-01-03 ENCOUNTER — Telehealth: Payer: Self-pay

## 2019-01-03 MED FILL — MODAFINIL 200 MG TABLET: 200 | 90 days supply | Qty: 90 | Fill #0

## 2019-01-03 NOTE — Telephone Encounter (Signed)
Received request for PA for modafinil from [facility]. Covermymeds was unavailable. Called [facility] and completed PA via phone. It was approved until 07/03/2019. [id number]. [facility] pharmacy notified.

## 2019-01-20 ENCOUNTER — Ambulatory Visit: Payer: No Typology Code available for payment source | Admitting: Neurology

## 2019-01-31 MED FILL — LOSARTAN POTASSIUM 100 MG T: 100 | 90 days supply | Qty: 90 | Fill #0

## 2019-02-17 MED FILL — TAMSULOSIN HCL 0.4 MG CAP: 0.4 | 90 days supply | Qty: 180 | Fill #0

## 2019-03-01 ENCOUNTER — Ambulatory Visit (INDEPENDENT_AMBULATORY_CARE_PROVIDER_SITE_OTHER): Payer: No Typology Code available for payment source | Admitting: Neurology

## 2019-03-01 ENCOUNTER — Other Ambulatory Visit: Payer: Self-pay

## 2019-03-01 ENCOUNTER — Encounter: Payer: Self-pay | Admitting: Neurology

## 2019-03-01 VITALS — BP 121/76 | HR 95 | Temp 97.8°F | Ht 69.0 in | Wt 178.0 lb

## 2019-03-01 DIAGNOSIS — G35 Multiple sclerosis: Secondary | ICD-10-CM | POA: Diagnosis not present

## 2019-03-01 NOTE — Progress Notes (Signed)
Reason for visit: Multiple sclerosis  Antonio Cook is an 64 y.o. male  History of present illness:  Antonio Cook is a 64 year old right-handed white male with a history of multiple sclerosis.  The patient is off disease modifying agents, he has done well, his last MRI in the summer 2020 was stable.  He has not had any new symptoms.  He did have a brief episode lasting less than 24 hours of vertigo 1 month ago, he has had similar vertigo in the past.  He has noted some mild balance issues, but he really has not had any change in his functional level.  He returns for an evaluation.  Past Medical History:  Diagnosis Date  . Hypertension   . MS (multiple sclerosis) ([place])     Past Surgical History:  Procedure Laterality Date  . ACHILLES TENDON REPAIR Left     Family History  Problem Relation Age of Onset  . Lymphoma Mother   . Cancer Father   . Cancer Maternal Grandfather   . Multiple sclerosis Neg Hx     Social history:  reports that he has never smoked. He has never used smokeless tobacco. He reports that he does not drink alcohol or use drugs.    Allergies  Allergen Reactions  . Gadolinium Derivatives Nausea And Vomiting    Pt vomited after injection.   Marland Kitchen Hctz [Hydrochlorothiazide] Rash    Medications:  Prior to Admission medications   Medication Sig Start Date End Date Taking? Authorizing Provider  ALPRAZolam Duanne Moron) 0.5 MG tablet Take 1 tablet (0.5 mg total) by mouth 3 (three) times daily as needed for anxiety. 12/23/17  Yes Kathrynn Ducking, MD  Cholecalciferol (VITAMIN D-1000 MAX ST) 1000 units tablet Take by mouth.   Yes [provider]  dalfampridine (AMPYRA) 10 MG TB12 Take 1 tablet (10 mg total) by mouth 2 (two) times daily. 11/18/16  Yes Kathrynn Ducking, MD  losartan (COZAAR) 100 MG tablet Take 100 mg by mouth daily.   Yes [provider]  Magnesium 250 MG TABS Take 250 mg by mouth.   Yes [provider]  modafinil (PROVIGIL)  200 MG tablet TAKE 1 TABLET BY MOUTH DAILY AS NEEDED. 02/16/18  Yes Kathrynn Ducking, MD  modafinil (PROVIGIL) 200 MG tablet TAKE 1 TABLET BY MOUTH ONCE DAILY 12/15/18  Yes Star Age, MD  ondansetron (ZOFRAN) 4 MG tablet Take 1 tablet (4 mg total) by mouth every 8 (eight) hours as needed for nausea or vomiting. 10/11/14  Yes Kathrynn Ducking, MD  zolpidem (AMBIEN) 10 MG tablet TAKE 1 TABLET BY MOUTH AT BEDTIME 12/15/18  Yes Star Age, MD    ROS:  Out of a complete 14 system review of symptoms, the patient complains only of the following symptoms, and all other reviewed systems are negative.  Slight fatigue  Blood pressure 121/76, pulse 95, temperature 97.8 F (36.6 C), height 5\' 9"  (1.753 m), weight 178 lb (80.7 kg).  Physical Exam  General: The patient is alert and cooperative at the time of the examination.  Skin: No significant peripheral edema is noted.   Neurologic Exam  Mental status: The patient is alert and oriented x 3 at the time of the examination. The patient has apparent normal recent and remote memory, with an apparently normal attention span and concentration ability.   Cranial nerves: Facial symmetry is present. Speech is normal, no aphasia or dysarthria is noted. Extraocular movements are full. Visual fields are full.  Pupils are equal, round, and reactive to light.  Discs are flat bilaterally.  Motor: The patient has good strength in all 4 extremities.  Sensory examination: Soft touch sensation is symmetric on the face, arms, and legs.  Coordination: The patient has good finger-nose-finger and heel-to-shin bilaterally.  Gait and station: The patient has a normal gait. Tandem gait is minimally unsteady. Romberg is negative. No drift is seen.  Reflexes: Deep tendon reflexes are symmetric.   MRI brain 09/12/18:  IMPRESSION:   MRI brain (without) demonstrating: - Multiple round and ovoid, periventricular, subcortical, juxtacortical, callosal and left  midbrain chronic demyelinating plaques. - No acute findings. No change from MRI on 08/04/17.   * MRI scan images were reviewed online. I agree with the written report.    Assessment/Plan:  1.  Multiple sclerosis  Clinically, the patient appears to be doing quite well.  MRI of the brain has been stable off of disease modifying agents.  We will check another MRI in about 6 to 8 months.  Marlan Palau MD 03/01/2019 3:57 PM  [facility] [address] [place], [place] [address]  Phone [phone] Fax [phone]

## 2019-03-14 MED FILL — ZOLPIDEM TARTRATE 10 MG TAB: 10 | 90 days supply | Qty: 90 | Fill #1

## 2019-04-18 IMAGING — US US PELVIS LIMITED
1 series · 14 of 24 positions shown · non-contrast
Comparison: CT 10/26/2016.

CLINICAL DATA: Frequency.

EXAM:
LIMITED ULTRASOUND OF PELVIS
TECHNIQUE: Limited transabdominal ultrasound examination of the pelvis was
performed.

[Series 1: us pelvis limited · 0.20mm/px · 14 of 24 slices shown]
[im 1/24]
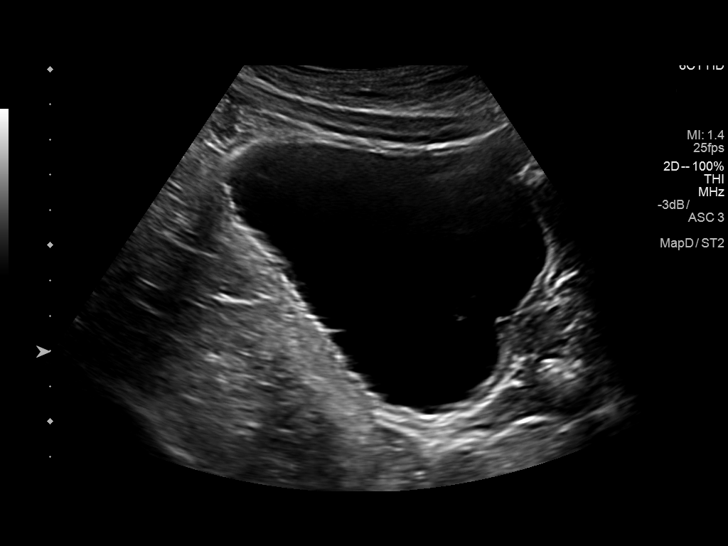
[im 3/24]
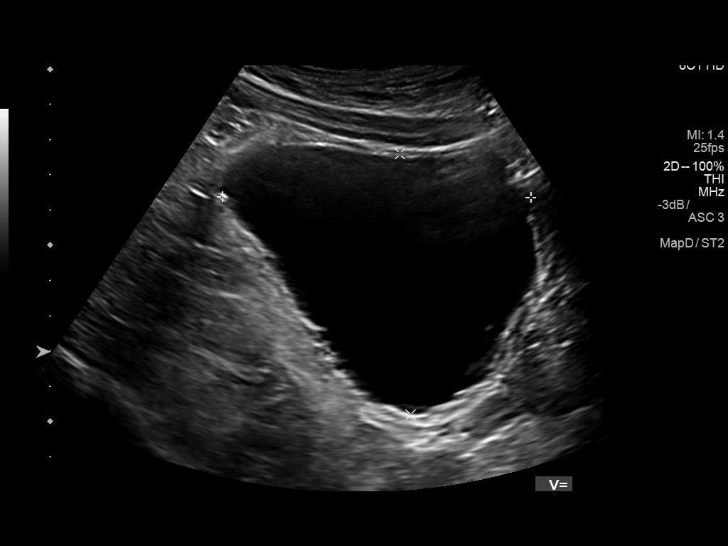
[im 5/24]
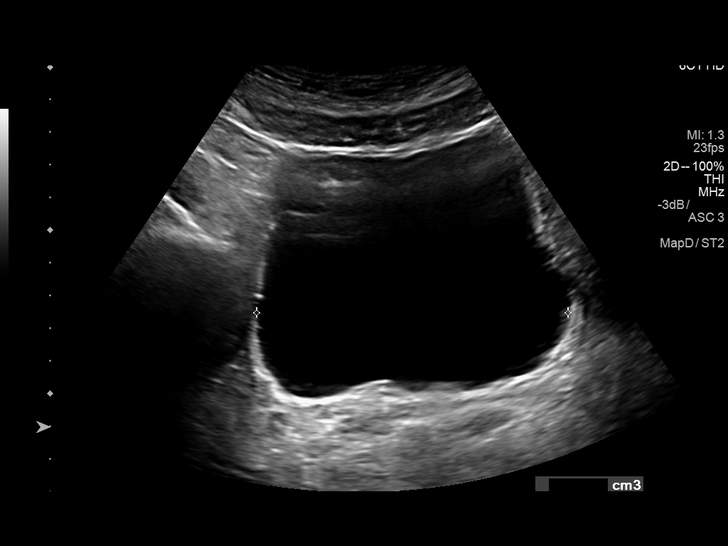
[im 7/24]
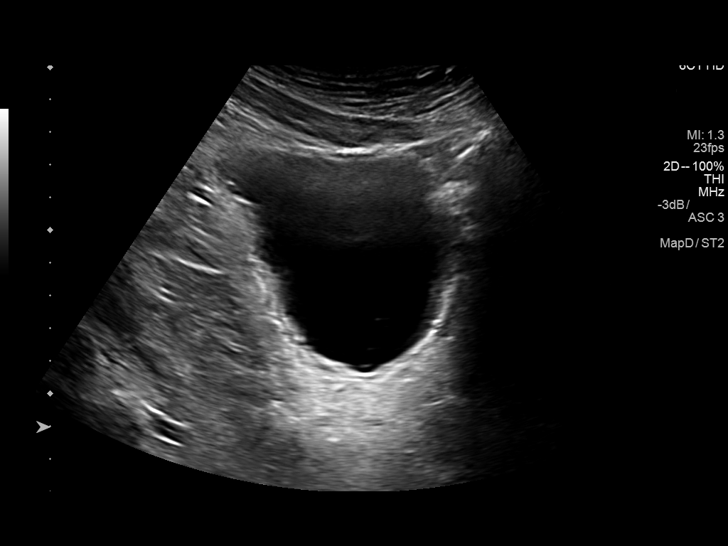
[im 8/24]
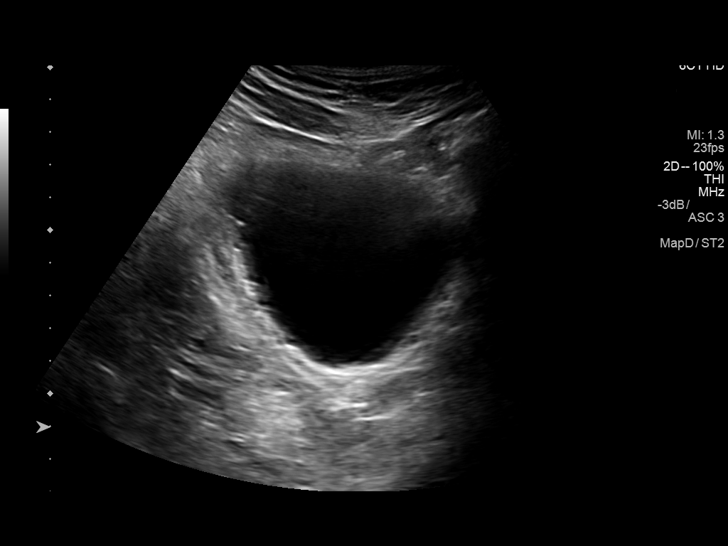
[im 10/24]
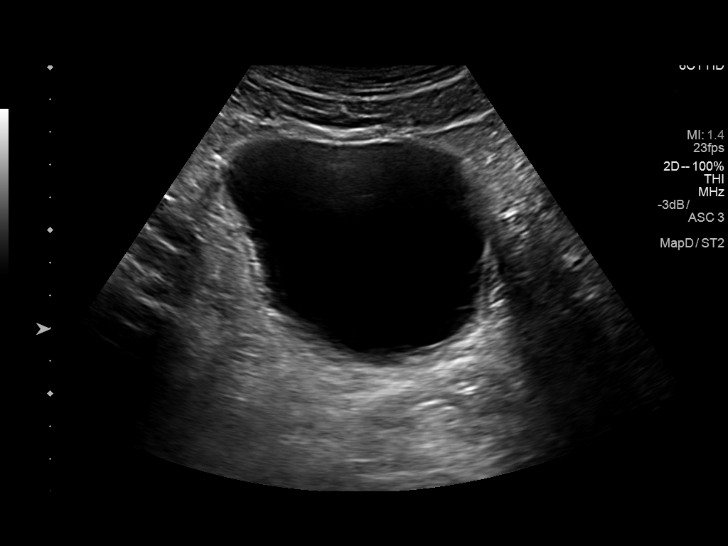
[im 12/24]
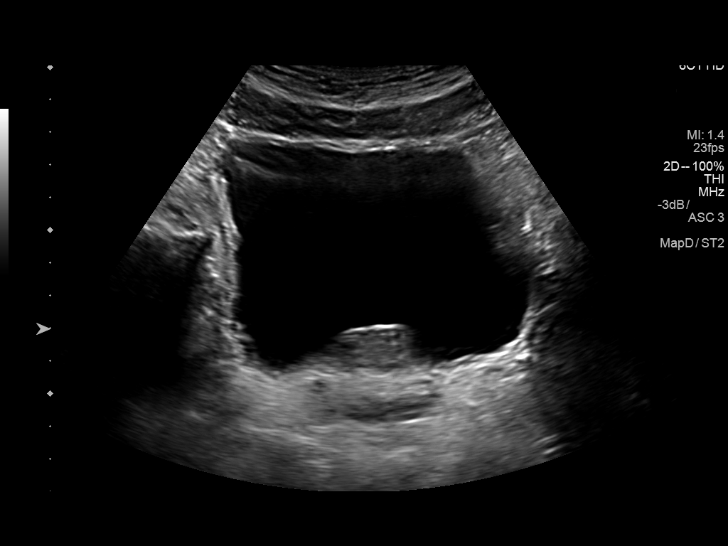
[im 13/24]
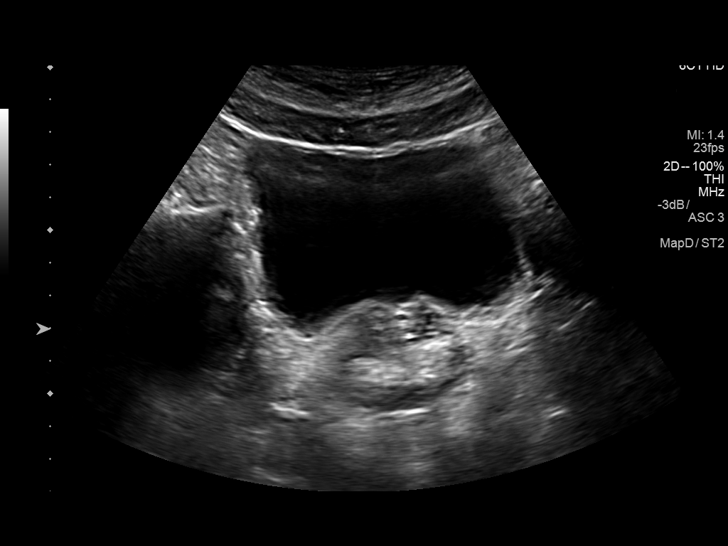
[im 15/24]
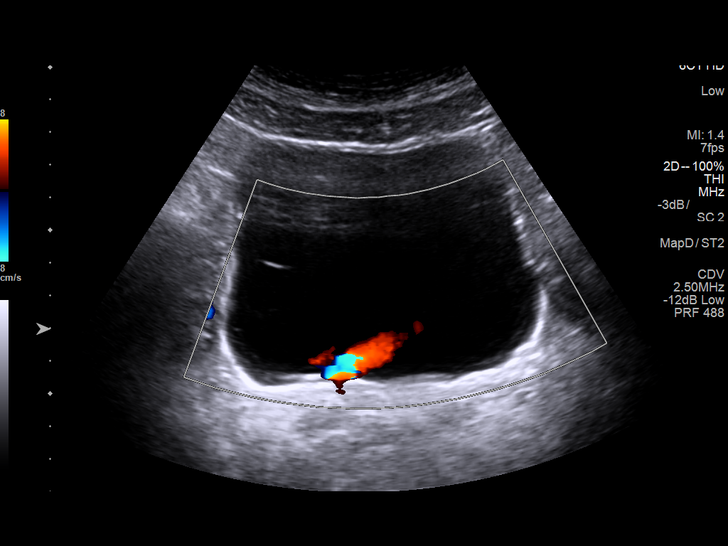
[im 17/24]
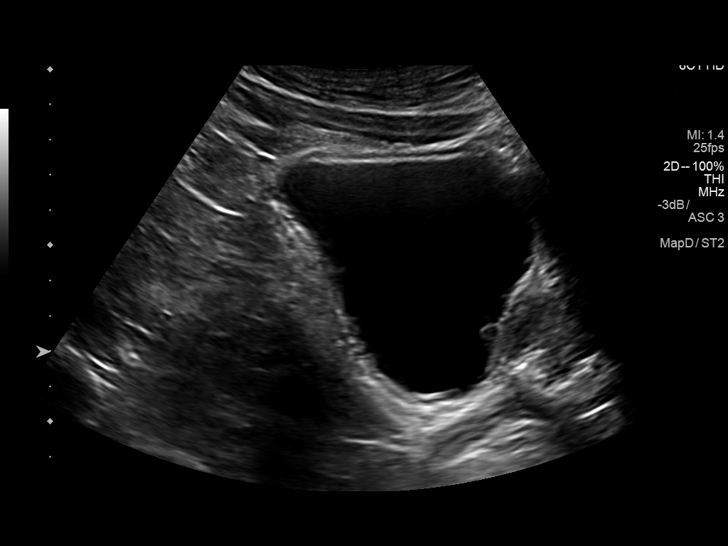
[im 19/24]
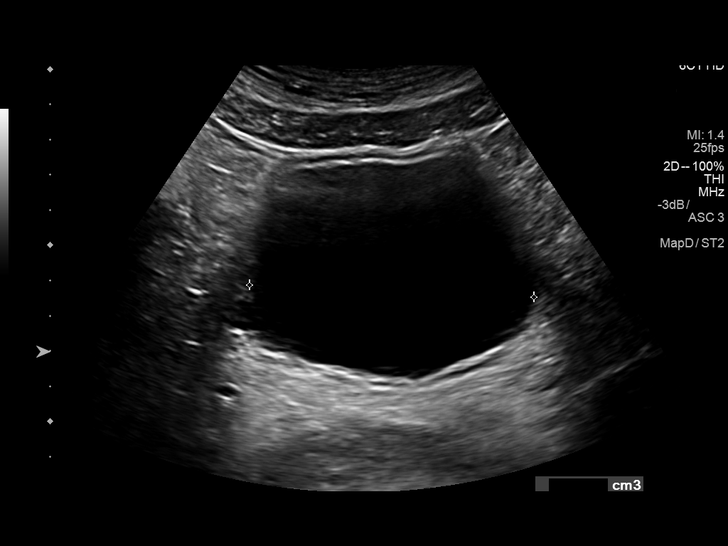
[im 20/24]
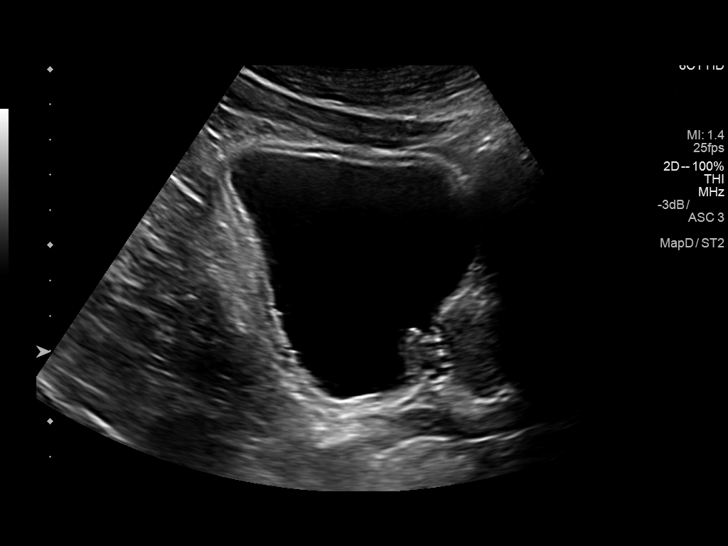
[im 22/24]
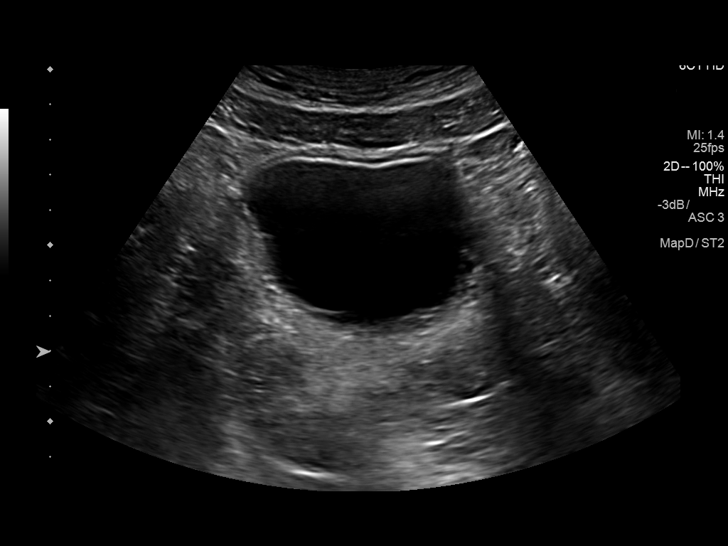
[im 24/24]
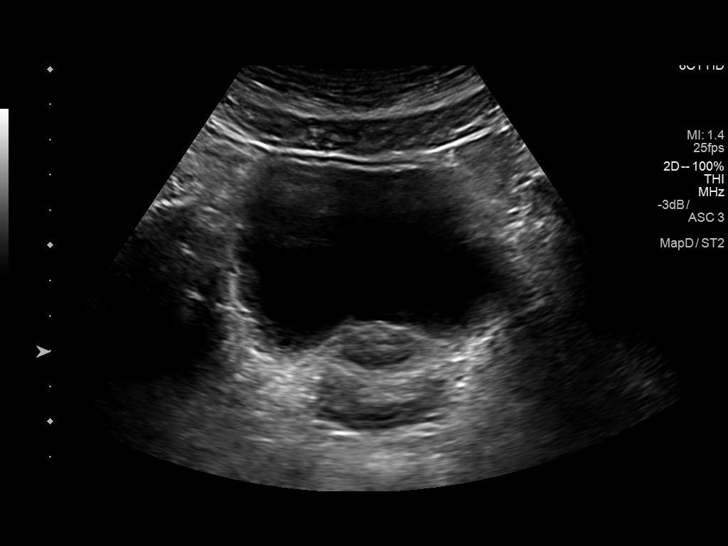

[14 of 24 positions shown; findings below may reference images not displayed]

FINDINGS: The urinary bladder has a normal appearance for the degree of
distention. Bilateral ureteral jets noted.

Pre-void volume:  324 ml

Post-void volume:  192 ml

Other findings:  Prominent prostate.
IMPRESSION: Prominent prostate with large postvoid residual.

## 2019-05-02 MED FILL — LOSARTAN POTASSIUM 100 MG T: 100 | 90 days supply | Qty: 90 | Fill #1

## 2019-05-16 MED FILL — TAMSULOSIN HCL 0.4 MG CAP: 0.4 | 90 days supply | Qty: 180 | Fill #1

## 2019-05-30 ENCOUNTER — Other Ambulatory Visit (HOSPITAL_COMMUNITY): Payer: Self-pay | Admitting: Internal Medicine

## 2019-05-30 MED FILL — MODAFINIL 200 MG TABS: 200 | 90 days supply | Qty: 90 | Fill #1

## 2019-05-30 MED FILL — MYRBETRIQ ER 50 MG TABLET: 50 | 30 days supply | Qty: 30 | Fill #0

## 2019-06-06 ENCOUNTER — Telehealth: Payer: Self-pay

## 2019-06-06 MED FILL — ZOLPIDEM TARTRATE 10 MG TAB: 10 | 90 days supply | Qty: 90 | Fill #0

## 2019-06-06 NOTE — Telephone Encounter (Signed)
Receive fax from [facility] that PA for modafinil was expiring soon. Diagnosis use was fatigue related to Multiple Sclerosis. Done on cover my meds.[facility] is reviewing your PA request. Typically an electronic response will be received within 72 hours. To check for an update later, open this request from your dashboard.  You may close this dialog and return to your dashboard to perform other tasks.

## 2019-06-06 NOTE — Telephone Encounter (Signed)
Approved today  Request Reference Number: [id number]. MODAFINIL TAB 200MG  is approved through 12/06/2019. Your patient may now fill this prescription and it will be covered.

## 2019-07-05 ENCOUNTER — Telehealth: Payer: Self-pay | Admitting: Neurology

## 2019-07-05 MED ORDER — DALFAMPRIDINE ER 10 MG PO TB12: 10.0000 mg | ORAL_TABLET | Freq: Two times a day (BID) | ORAL | 1 refills | Status: DC

## 2019-07-05 NOTE — Telephone Encounter (Signed)
I called the patient.  The patient continues to believe that he is having some difficulty with walking, Ampyra was used previously, he has been off of it over the last year but wants to retry this to help the stamina of his gait.  I will send in a prescription.

## 2019-07-05 NOTE — Telephone Encounter (Signed)
Pt called wanting to ask if he can restart taking the Ampyra that he used to be on a while ago for his MS. Please advise.

## 2019-07-15 NOTE — Telephone Encounter (Signed)
Pt has called to provide the name of the Special[facility] pharmacy: [facility] [phone] for his Ampyra .  Pt stated this was information Katrina, Charity fundraiser needed

## 2019-07-18 ENCOUNTER — Other Ambulatory Visit: Payer: Self-pay

## 2019-07-18 MED ORDER — DALFAMPRIDINE ER 10 MG PO TB12: 10.0000 mg | ORAL_TABLET | Freq: Two times a day (BID) | ORAL | 1 refills | Status: DC

## 2019-07-18 NOTE — Telephone Encounter (Signed)
I called listed number and pharmacy name was [facility]. They verified fax number. Ampyra was sent there per pt request.

## 2019-07-18 NOTE — Telephone Encounter (Signed)
Pt called to discuss Ampyra. Pt aware this was e-scribed around 9:30 AM this morning to [facility]. He is aware they should be calling him within 48 hours and he was given their number to call if needed. He verbalized appreciation.

## 2019-07-25 ENCOUNTER — Telehealth: Payer: Self-pay | Admitting: Adult Health

## 2019-07-25 NOTE — Telephone Encounter (Signed)
Called LVM. Inquiring about PA for ampyra. Please call back.

## 2019-07-25 NOTE — Telephone Encounter (Addendum)
I called [facility] and did PA via phone. I stated generic dalfampridine (AMPYRA) 10 MG TB12 [[id number]]   was sent to mail order on 07/17/2019 but no fax PA was sent to our office. They stated it was fax but they can do PA over the phone. PA was done with Multiple sclerosis ICD code G35. Medication approve from 07/25/2019 to 07/24/2020.

## 2019-07-26 MED FILL — LOSARTAN POTASSIUM 100 MG T: 100 | 90 days supply | Qty: 90 | Fill #2

## 2019-07-26 NOTE — Telephone Encounter (Signed)
Receive fax approval from [facility] that Dalfampridin (ampyra) approve from 07/25/2019 to 07/24/2020. PA department number is [phone].

## 2019-07-26 NOTE — Telephone Encounter (Addendum)
I called pt that medication was approve for a year on 07/25/2019  and to call the pharmacy to set up delivery times and payment for medication. Pt was given listed number for [facility] number.He verbalized understanding.

## 2019-08-02 MED FILL — MYRBETRIQ ER 50 MG TABLET: 50 | 30 days supply | Qty: 30 | Fill #1

## 2019-08-15 MED FILL — TAMSULOSIN HCL 0.4 MG CAP: 0.4 | 90 days supply | Qty: 180 | Fill #2

## 2019-08-16 ENCOUNTER — Other Ambulatory Visit: Payer: Self-pay

## 2019-08-16 ENCOUNTER — Other Ambulatory Visit (HOSPITAL_COMMUNITY): Payer: Self-pay | Admitting: Internal Medicine

## 2019-08-16 DIAGNOSIS — G35 Multiple sclerosis: Secondary | ICD-10-CM

## 2019-08-16 MED ORDER — ZOLPIDEM TARTRATE 10 MG PO TABS: 10.0000 mg | ORAL_TABLET | Freq: Every day | ORAL | 1 refills | Status: DC

## 2019-08-24 ENCOUNTER — Telehealth: Payer: Self-pay | Admitting: Neurology

## 2019-08-24 MED FILL — ZOLPIDEM TARTRATE 10 MG TAB: 10 | 90 days supply | Qty: 90 | Fill #0

## 2019-08-24 NOTE — Telephone Encounter (Signed)
[facility] pharmacist is calling from [employer] asking for a drug over ride  for patient because he is leaving to go out of town this Friday 08/26/2019 and will be gone for two weeks 09/10/2019 .   Maxine Glenn is asking for a call back at [phone] .  Ambien 10 mg .

## 2019-08-24 NOTE — Telephone Encounter (Signed)
Ok for earlier refill of [place]

## 2019-08-24 NOTE — Telephone Encounter (Addendum)
I returned the call to [employer] and spoke to Arlys John [facility] had left for the day). They were provided with Dr. Zannie Cove authorization to fill the Ambien early.

## 2019-09-19 ENCOUNTER — Other Ambulatory Visit: Payer: Self-pay | Admitting: Neurology

## 2019-09-21 ENCOUNTER — Telehealth: Payer: Self-pay | Admitting: Neurology

## 2019-09-21 MED FILL — MODAFINIL 200 MG TABS: 200 | 90 days supply | Qty: 90 | Fill #0

## 2019-09-21 NOTE — Telephone Encounter (Signed)
Pt is asking for a call from RN re: his modafinil (PROVIGIL) 200 MG tablet

## 2019-09-21 NOTE — Telephone Encounter (Signed)
I called Dr. Donell Beers back and advised we sent Modafinil rx to [facility] pharm this am.  Pt verbalized understanding and had no further questions/concerns

## 2019-10-04 MED FILL — MYRBETRIQ ER 50 MG TABLET: 50 | 30 days supply | Qty: 30 | Fill #2

## 2019-10-27 MED FILL — LOSARTAN POTASSIUM 100 MG T: 100 | 90 days supply | Qty: 90 | Fill #0

## 2019-11-01 ENCOUNTER — Telehealth: Payer: Self-pay | Admitting: Neurology

## 2019-11-01 NOTE — Telephone Encounter (Signed)
I called the patient.  I called about scheduling a follow-up MRI of the brain as he is off disease modifying agents.  He is converting to Medicare, he wishes to hold off until the transition occurs, he will call me back when he is ready to do the MRI.

## 2019-11-17 MED FILL — TAMSULOSIN HCL 0.4 MG CAP: 0.4 | 90 days supply | Qty: 180 | Fill #3

## 2019-11-26 MED FILL — MYRBETRIQ ER 50 MG TABLET: 50 | 30 days supply | Qty: 30 | Fill #3

## 2019-11-29 MED FILL — ZOLPIDEM TARTRATE 10 MG TAB: 10 | 90 days supply | Qty: 90 | Fill #1

## 2020-01-11 ENCOUNTER — Telehealth: Payer: Self-pay | Admitting: Emergency Medicine

## 2020-01-11 NOTE — Telephone Encounter (Signed)
A PA for Modafinil 200 mg has been started on CMM.  Key: [id number] - PA Case ID: [id number].  Awaiting determination from [facility].

## 2020-01-12 MED FILL — MODAFINIL 200 MG TABLET: 200 | 30 days supply | Qty: 30 | Fill #1

## 2020-01-12 NOTE — Telephone Encounter (Signed)
Approvedon December 1 Request Reference Number: [id number]. MODAFINIL TAB 200MG  is approved through 07/11/2020.

## 2020-01-27 MED FILL — LOSARTAN POTASSIUM 100 MG T: 100 | 90 days supply | Qty: 90 | Fill #1

## 2020-02-13 MED FILL — TAMSULOSIN HCL 0.4 MG CAP: 0.4 | 45 days supply | Qty: 90 | Fill #0

## 2020-02-14 ENCOUNTER — Other Ambulatory Visit (HOSPITAL_COMMUNITY): Payer: Self-pay | Admitting: Internal Medicine

## 2020-02-14 MED FILL — ETODOLAC 500 MG TABS: 500 | 30 days supply | Qty: 30 | Fill #0

## 2020-02-20 ENCOUNTER — Other Ambulatory Visit: Payer: Self-pay | Admitting: Neurology

## 2020-02-20 DIAGNOSIS — G35 Multiple sclerosis: Secondary | ICD-10-CM

## 2020-02-22 ENCOUNTER — Other Ambulatory Visit: Payer: Self-pay | Admitting: Neurology

## 2020-02-22 MED FILL — ZOLPIDEM TARTRATE 10 MG TAB: 10 | 30 days supply | Qty: 30 | Fill #0

## 2020-03-01 ENCOUNTER — Encounter: Payer: Self-pay | Admitting: Neurology

## 2020-03-01 ENCOUNTER — Other Ambulatory Visit: Payer: Self-pay

## 2020-03-01 ENCOUNTER — Ambulatory Visit (INDEPENDENT_AMBULATORY_CARE_PROVIDER_SITE_OTHER): Payer: Medicare Other | Admitting: Neurology

## 2020-03-01 VITALS — BP 126/84 | HR 87 | Ht 69.5 in | Wt 176.1 lb

## 2020-03-01 DIAGNOSIS — G35 Multiple sclerosis: Secondary | ICD-10-CM | POA: Diagnosis not present

## 2020-03-01 NOTE — Progress Notes (Signed)
Reason for visit: Multiple sclerosis  Antonio Cook is an 65 y.o. 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  He is off of disease modifying agents and he has done quite well.  He is not having any new symptoms.  He has had multiple sclerosis for over 20 years with very minimal progression.  The patient does have some fatigue issues, he will take Provigil off and on if needed.  He uses Ambien for sleep.  He last had MRI of the brain in August 2020.  He does have occasional low back pain, he will get some physical therapy and he is doing some stretching exercises.  Overall, he is doing quite well.  Past Medical History:  Diagnosis Date   Hypertension    MS (multiple sclerosis) (HCC)     Past Surgical History:  Procedure Laterality Date   ACHILLES TENDON REPAIR Left     Family History  Problem Relation Age of Onset   Lymphoma Mother    Cancer Father    Cancer Maternal Grandfather    Multiple sclerosis Neg Hx     Social history:  reports that he has never smoked. He has never used smokeless tobacco. He reports that he does not drink alcohol and does not use drugs.    Allergies  Allergen Reactions   Gadolinium Derivatives Nausea And Vomiting    Pt vomited after injection.    Hctz [Hydrochlorothiazide] Rash    Medications:  Prior to Admission medications   Medication Sig Start Date End Date Taking? Authorizing Provider  ALPRAZolam Prudy Feeler) 0.5 MG tablet Take 1 tablet (0.5 mg total) by mouth 3 (three) times daily as needed for anxiety. 12/23/17  Yes York Spaniel, MD  Cholecalciferol 25 MCG (1000 UT) tablet Take by mouth.   Yes [provider]  losartan (COZAAR) 100 MG tablet Take 100 mg by mouth daily.   Yes [provider]  Magnesium 250 MG TABS Take 250 mg by mouth.   Yes [provider]  modafinil (PROVIGIL) 200 MG tablet TAKE 1 TABLET BY MOUTH  DAILY AS NEEDED. 02/16/18  Yes York Spaniel, MD  modafinil (PROVIGIL) 200 MG tablet TAKE 1 TABLET BY MOUTH ONCE DAILY 09/21/19  Yes Levert Feinstein, MD  ondansetron (ZOFRAN) 4 MG tablet Take 1 tablet (4 mg total) by mouth every 8 (eight) hours as needed for nausea or vomiting. 10/11/14  Yes York Spaniel, MD  zolpidem (AMBIEN) 10 MG tablet TAKE 1 TABLET BY MOUTH AT BEDTIME 02/22/20  Yes Anson Fret, MD    ROS:  Out of a complete 14 system review of symptoms, the patient complains only of the following symptoms, and all other reviewed systems are negative.  Left leg fatigue  Blood pressure 126/84, pulse 87, height 5' 9.5" (1.765 m), weight 176 lb 2 oz (79.9 kg), SpO2 98 %.  Physical Exam  General: The patient is alert and cooperative at the time of the examination.  Skin: No significant peripheral edema is noted.   Neurologic Exam  Mental status: The patient is alert and oriented x 3 at the time of the examination. The patient has apparent normal recent and remote memory, with an apparently normal attention span and concentration ability.   Cranial nerves: Facial symmetry is present. Speech is normal, no aphasia or dysarthria is noted. Extraocular movements are full. Visual fields are full.  Pupils are equal, round,  and reactive to light.  Discs are flat bilaterally.  Motor: The patient has good strength in all 4 extremities.  Sensory examination: Soft touch sensation is symmetric on the face, arms, and legs.  Coordination: The patient has good finger-nose-finger and heel-to-shin bilaterally.  Gait and station: The patient has a normal gait. Tandem gait is slightly unsteady. Romberg is negative. No drift is seen.  Reflexes: Deep tendon reflexes are symmetric.   Assessment/Plan:  1.  Multiple sclerosis  The patient appears to have a relatively benign course of multiple sclerosis.  We are watching off of disease modifying agents.  I have recommended a repeat MRI of the  brain, the patient is somewhat claustrophobic and wishes to "think about it".  The patient will let me know if he agrees to have the study done.  He has had recent blood work done through his primary care physician.  He will follow-up here in 8 months.  Marlan Palau MD 03/01/2020 3:07 PM  [facility] [address] [place], [place] [address]  Phone [phone] Fax [phone]

## 2020-03-20 ENCOUNTER — Other Ambulatory Visit: Payer: Self-pay | Admitting: Neurology

## 2020-03-20 DIAGNOSIS — G35 Multiple sclerosis: Secondary | ICD-10-CM

## 2020-03-21 ENCOUNTER — Other Ambulatory Visit: Payer: Self-pay | Admitting: Neurology

## 2020-03-21 ENCOUNTER — Encounter: Payer: Self-pay | Admitting: *Deleted

## 2020-03-21 ENCOUNTER — Telehealth: Payer: Self-pay | Admitting: Neurology

## 2020-03-21 MED ORDER — MODAFINIL 200 MG PO TABS: 200.0000 mg | ORAL_TABLET | Freq: Every day | ORAL | 3 refills | Status: DC

## 2020-03-21 MED FILL — ZOLPIDEM TARTRATE 10 MG TAB: 10 | 30 days supply | Qty: 30 | Fill #0

## 2020-03-21 NOTE — Telephone Encounter (Signed)
90 day supply

## 2020-03-21 NOTE — Telephone Encounter (Signed)
Prescription has been sent to [facility] as requested

## 2020-03-21 NOTE — Telephone Encounter (Signed)
Called [facility] outpt pharmacy and cx rx sent earlier. Will re-send to Dr. Vickey Huger so she can e-scribe to [facility] instead per pt request.

## 2020-03-21 NOTE — Telephone Encounter (Signed)
Pt called, need to change pharmacy for  modafinil (PROVIGIL) 200 MG tablet.  Send refill  to [facility] [address] [facility] Phone no: [phone].

## 2020-03-28 ENCOUNTER — Other Ambulatory Visit (HOSPITAL_COMMUNITY): Payer: Self-pay | Admitting: Internal Medicine

## 2020-03-28 MED FILL — TAMSULOSIN HCL 0.4 MG CAP: 0.4 | 90 days supply | Qty: 180 | Fill #0

## 2020-03-29 ENCOUNTER — Ambulatory Visit: Payer: No Typology Code available for payment source | Admitting: Sports Medicine

## 2020-04-12 ENCOUNTER — Ambulatory Visit (INDEPENDENT_AMBULATORY_CARE_PROVIDER_SITE_OTHER): Payer: Medicare Other | Admitting: Sports Medicine

## 2020-04-12 ENCOUNTER — Other Ambulatory Visit: Payer: Self-pay

## 2020-04-12 DIAGNOSIS — S83001A Unspecified subluxation of right patella, initial encounter: Secondary | ICD-10-CM

## 2020-04-13 DIAGNOSIS — S83001A Unspecified subluxation of right patella, initial encounter: Secondary | ICD-10-CM | POA: Insufficient documentation

## 2020-04-13 NOTE — Assessment & Plan Note (Signed)
I suspect this is worsened by his chronic left leg weakness He needs to try to settle this down Compression sleeve x for sleep Focus on VMO weakness with HEP Switch to patellar subluxation brace if he gets recurrences  Icing  Reck 4 weeks

## 2020-04-13 NOTE — Progress Notes (Signed)
CC; Right knee pain  Years ago the patient had an episode where the knee popped Seemed that patella moved out Finally able to straighten the knee and the knee cap popped again Sharp pain stopped Sore for a few days  Periodically he has had brief sharp pain and similar episodes  Over past month this seems to be coming frequently He thinks he has had 4 episodes Same sxs Knee pops and he can straighten the leg again Pain is very sharp Some soreness after this resolves  ROS No dislocations of other joints Left knee does not feel unstable Rt knee feels unstable if he rotates outward at times  PE Pleasant M in NAD BP 112/84   Ht 5' 9.5" (1.765 m)   Wt 172 lb (78 kg)   BMI 25.04 kg/m    Knee: RT Normal to inspection with no erythema or effusion or obvious bony abnormalities. Palpation normal with no warmth or joint line tenderness Some mild lateral patellar tenderness or condyle tenderness. TTP at left superior capsule ROM normal in flexion and extension and lower leg rotation. Ligaments with solid consistent endpoints including ACL, PCL, LCL, MCL. Negative Mcmurray's and provocative meniscal tests. Non painful patellar compression. Patellar and quadriceps tendons unremarkable. Note tracking shows some increased motion on RT not seen on left but on palpation is not excessively lax Hamstring and quadriceps strength is normal.  Ultrasound of RT knee No SPP swelling Medial and lat. Meniscus normal Patellar and quad tendons normal Trochlear groove shows minor abnormality at sup. 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 Darrick Penna, MD

## 2020-04-23 ENCOUNTER — Other Ambulatory Visit: Payer: Self-pay | Admitting: Neurology

## 2020-04-23 DIAGNOSIS — G35 Multiple sclerosis: Secondary | ICD-10-CM

## 2020-05-31 ENCOUNTER — Other Ambulatory Visit (HOSPITAL_COMMUNITY): Payer: Self-pay

## 2020-05-31 MED ORDER — PAXLOVID 20 X 150 MG & 10 X 100MG PO TBPK
ORAL_TABLET | ORAL | 0 refills | Status: DC
  Filled 2020-05-31: qty 30, 5d supply, fill #0

## 2020-06-26 ENCOUNTER — Other Ambulatory Visit (HOSPITAL_COMMUNITY): Payer: Self-pay

## 2020-06-26 MED FILL — Tamsulosin HCl Cap 0.4 MG: ORAL | 45 days supply | Qty: 90 | Fill #0 | Status: AC

## 2020-07-12 ENCOUNTER — Other Ambulatory Visit (HOSPITAL_COMMUNITY): Payer: Self-pay

## 2020-07-12 MED FILL — Zolpidem Tartrate Tab 10 MG: ORAL | 90 days supply | Qty: 90 | Fill #0 | Status: AC

## 2020-07-13 ENCOUNTER — Other Ambulatory Visit (HOSPITAL_COMMUNITY): Payer: Self-pay

## 2020-07-13 MED FILL — Losartan Potassium Tab 100 MG: ORAL | 90 days supply | Qty: 90 | Fill #0 | Status: AC

## 2020-08-08 ENCOUNTER — Other Ambulatory Visit (HOSPITAL_COMMUNITY): Payer: Self-pay

## 2020-08-08 MED FILL — Tamsulosin HCl Cap 0.4 MG: ORAL | 90 days supply | Qty: 180 | Fill #0 | Status: AC

## 2020-08-22 ENCOUNTER — Other Ambulatory Visit (HOSPITAL_COMMUNITY): Payer: Self-pay

## 2020-08-22 ENCOUNTER — Telehealth: Payer: Self-pay | Admitting: Neurology

## 2020-08-22 DIAGNOSIS — G35 Multiple sclerosis: Secondary | ICD-10-CM

## 2020-08-22 MED ORDER — ALPRAZOLAM 0.5 MG PO TABS
ORAL_TABLET | ORAL | 0 refills | Status: DC
  Filled 2020-08-22 – 2020-09-11 (×2): qty 3, 1d supply, fill #0

## 2020-08-22 NOTE — Telephone Encounter (Signed)
Patient called in about getting MRI. I don't see an order in for patient. His last MRI was completed 09/11/2018.

## 2020-08-22 NOTE — Addendum Note (Signed)
Addended by: York Spaniel on: 08/22/2020 05:17 PM   Modules accepted: Orders

## 2020-08-22 NOTE — Telephone Encounter (Signed)
I called the patient.  The patient is ready to have MRI of the brain done.  I will get this ordered.

## 2020-08-23 ENCOUNTER — Telehealth: Payer: Self-pay | Admitting: Neurology

## 2020-08-23 ENCOUNTER — Other Ambulatory Visit (HOSPITAL_COMMUNITY): Payer: Self-pay

## 2020-08-23 NOTE — Telephone Encounter (Signed)
08/23/20 Medicare sent to [facility] They obtain auth [facility]

## 2020-08-29 ENCOUNTER — Telehealth: Payer: Self-pay

## 2020-08-29 NOTE — Telephone Encounter (Signed)
Called and spoke to the patient regarding his allergy to MR contrast. The patient has an upcoming MR With contrast on 09/15/20 at 0900. The patient reports the last time he had an MR with contrast he got very nauseas and threw up once. The patient denies any itching, rash, hives, shortness of breath, etc... Therefor, a 13 hr prep will not be needed for this scan. The patient stated he will take 50 mg of benadryl 1 hr prior to his scan to help with the n/v.  Advised the patient to have a driver while taking benadryl as it may cause drowsiness. Pt verbalized understanding.

## 2020-08-31 ENCOUNTER — Other Ambulatory Visit (HOSPITAL_COMMUNITY): Payer: Self-pay

## 2020-09-11 ENCOUNTER — Other Ambulatory Visit (HOSPITAL_COMMUNITY): Payer: Self-pay

## 2020-09-11 ENCOUNTER — Telehealth: Payer: Self-pay | Admitting: Neurology

## 2020-09-11 NOTE — Telephone Encounter (Signed)
Pt called requesting refill for ALPRAZolam Prudy Feeler) 0.5 MG tablet for his MRI that is scheduled for 09/15/2020. Pharmacy [facility].

## 2020-09-11 NOTE — Telephone Encounter (Signed)
Called patient, informed him Rx has been sent in on 08/22/20. Patient verbalized understanding, appreciation.

## 2020-09-15 ENCOUNTER — Ambulatory Visit
Admission: RE | Admit: 2020-09-15 | Discharge: 2020-09-15 | Disposition: A | Discharge status: Home / Self Care | Payer: Medicare Other | Source: Ambulatory Visit | Attending: Neurology | Admitting: Neurology

## 2020-09-15 ENCOUNTER — Other Ambulatory Visit: Payer: Self-pay

## 2020-09-15 DIAGNOSIS — G35 Multiple sclerosis: Secondary | ICD-10-CM | POA: Diagnosis not present

## 2020-09-15 MED ORDER — GADOBENATE DIMEGLUMINE 529 MG/ML IV SOLN
15.0000 mL | Freq: Once | INTRAVENOUS | Status: AC | PRN
  Administered 2020-09-15: 15 mL via INTRAVENOUS

## 2020-09-17 ENCOUNTER — Telehealth: Payer: Self-pay | Admitting: Neurology

## 2020-09-17 NOTE — Telephone Encounter (Signed)
Pt called wanting to know the results of his MRI scan he had done over the weekend. Please advise.

## 2020-09-17 NOTE — Telephone Encounter (Signed)
Contacted pt back, informed him that we are still waiting on finial result from is MRI, he was hoping for results today, informed him we will give him a call back as soon as we receive it, . He understood and was appreciative of the call back.

## 2020-09-17 NOTE — Telephone Encounter (Signed)
  I called patient.  MRI of the brain was unchanged from 2 years ago, no alteration in medical therapy recommended.  The patient is not on any disease modifying agents.  MRI brain 09/16/20:  IMPRESSION: This MRI of the brain with and without contrast shows the following: 1.  T2/FLAIR hyperintense foci in the hemispheres and brainstem in a pattern consistent with chronic demyelinating plaque associated with multiple sclerosis.  None of the foci appear to be acute.  They do not enhance.  Compared to the MRI dated 09/11/2018, there are no new lesions. 2.   No acute findings.  Normal enhancement pattern.

## 2020-10-08 ENCOUNTER — Other Ambulatory Visit: Payer: Self-pay | Admitting: Neurology

## 2020-10-08 ENCOUNTER — Other Ambulatory Visit (HOSPITAL_COMMUNITY): Payer: Self-pay

## 2020-10-08 DIAGNOSIS — G35 Multiple sclerosis: Secondary | ICD-10-CM

## 2020-10-08 MED FILL — Losartan Potassium Tab 100 MG: ORAL | 90 days supply | Qty: 90 | Fill #1 | Status: AC

## 2020-10-09 ENCOUNTER — Other Ambulatory Visit (HOSPITAL_COMMUNITY): Payer: Self-pay

## 2020-10-09 MED ORDER — ZOLPIDEM TARTRATE 10 MG PO TABS
ORAL_TABLET | Freq: Every day | ORAL | 1 refills | Status: DC
  Filled 2020-10-09: qty 90, 90d supply, fill #0
  Filled 2021-01-07: qty 90, 90d supply, fill #1

## 2020-10-25 ENCOUNTER — Ambulatory Visit: Payer: Medicare Other | Admitting: Neurology

## 2020-10-29 ENCOUNTER — Other Ambulatory Visit (HOSPITAL_COMMUNITY): Payer: Self-pay

## 2020-10-29 ENCOUNTER — Telehealth: Payer: Self-pay | Admitting: Neurology

## 2020-10-29 ENCOUNTER — Other Ambulatory Visit: Payer: Self-pay | Admitting: Neurology

## 2020-10-29 MED ORDER — MODAFINIL 200 MG PO TABS
200.0000 mg | ORAL_TABLET | Freq: Every day | ORAL | 3 refills | Status: DC
  Filled 2020-10-29: qty 90, 90d supply, fill #0

## 2020-10-29 NOTE — Telephone Encounter (Signed)
I will send in a prescription for the Provigil.

## 2020-10-29 NOTE — Telephone Encounter (Signed)
Antonio Cook from [facility] called and LVM stating that the pt's modafinil (PROVIGIL) 200 MG tablet was transferred to them from [facility] but it is expired so they are needing a new Rx.

## 2020-10-30 ENCOUNTER — Other Ambulatory Visit (HOSPITAL_COMMUNITY): Payer: Self-pay

## 2020-10-30 ENCOUNTER — Encounter: Payer: Self-pay | Admitting: Neurology

## 2020-10-30 ENCOUNTER — Ambulatory Visit (INDEPENDENT_AMBULATORY_CARE_PROVIDER_SITE_OTHER): Payer: Medicare Other | Admitting: Neurology

## 2020-10-30 VITALS — BP 102/68 | HR 97 | Ht 69.0 in | Wt 173.0 lb

## 2020-10-30 DIAGNOSIS — G35 Multiple sclerosis: Secondary | ICD-10-CM

## 2020-10-30 MED ORDER — DALFAMPRIDINE ER 10 MG PO TB12
10.0000 mg | ORAL_TABLET | Freq: Two times a day (BID) | ORAL | 3 refills | Status: DC
  Filled 2020-10-30: qty 60, 30d supply, fill #0
  Filled 2020-12-12: qty 60, 30d supply, fill #1
  Filled 2021-01-14: qty 60, 30d supply, fill #2

## 2020-10-30 NOTE — Progress Notes (Signed)
Reason for visit: Multiple sclerosis  Antonio Cook is an 65 y.o. 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  Clinically, he has done fairly well with very few MS exacerbations over his lifetime.  His initial presenting symptom was some hearing deficit in 1 ear.  He was followed for several years through Dr. Sandria Manly on [facility].  About 6 years ago he had onset of some weakness of the left leg.  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  He currently is not on any disease modifying agents.  Over the last year he feels that there has been some gradual change in fatigability of the lower extremities, left greater than right.  His fatigue is present later in the day.  He denies any severe balance issues or difficulty with falls.  He has had some frequency of the bladder.  He denies any overt numbness of the body or any visual changes.  He is not very physically active.  He comes over today for an evaluation.  Past Medical History:  Diagnosis Date   Hypertension    MS (multiple sclerosis) (HCC)     Past Surgical History:  Procedure Laterality Date   ACHILLES TENDON REPAIR Left     Family History  Problem Relation Age of Onset   Lymphoma Mother    Cancer Father    Cancer Maternal Grandfather    Multiple sclerosis Neg Hx     Social history:  reports that he has never smoked. He has never used smokeless tobacco. He reports that he does not drink alcohol and does not use drugs.    Allergies  Allergen Reactions   Gadolinium Derivatives Nausea And Vomiting    Pt vomited after injection.    Hctz [Hydrochlorothiazide] Rash    Medications:  Prior to Admission medications   Medication Sig Start Date End Date Taking? Authorizing Provider  ALPRAZolam Prudy Feeler) 0.5 MG tablet Take 2 tablets approximately 45 minutes prior to the MRI study, take a third tablet if needed.  08/22/20  Yes York Spaniel, MD  Cholecalciferol 25 MCG (1000 UT) tablet Take by mouth.   Yes [provider]  etodolac (LODINE) 500 MG tablet TAKE 1 TABLET BY MOUTH ONCE A DAY AS NEEDED 02/14/20 02/13/21 Yes Avva, Ravisankar, MD  losartan (COZAAR) 100 MG tablet Take 100 mg by mouth daily.   Yes [provider]  losartan (COZAAR) 100 MG tablet TAKE 1 TABLET BY MOUTH AT BEDTIME 04/23/20 04/23/21 Yes Avva, Ravisankar, MD  Magnesium 250 MG TABS Take 250 mg by mouth.   Yes [provider]  modafinil (PROVIGIL) 200 MG tablet Take 1 tablet by mouth daily. 10/29/20  Yes York Spaniel, MD  Nirmatrelvir & Ritonavir (PAXLOVID) 20 x 150 MG & 10 x 100MG  TBPK Take 3 tablets by mouth twice daily for 5 days. 05/31/20  Yes   ondansetron (ZOFRAN) 4 MG tablet Take 1 tablet (4 mg total) by mouth every 8 (eight) hours as needed for nausea or vomiting. 10/11/14  Yes 10/13/14, MD  tamsulosin (FLOMAX) 0.4 MG CAPS capsule TAKE 1 CAPSULE BY MOUTH 2 TIMES DAILY 03/28/20 03/28/21 Yes Avva, Ravisankar, MD  zolpidem (AMBIEN) 10 MG tablet TAKE 1 TABLET BY MOUTH AT BEDTIME 10/09/20 04/07/21 Yes 04/09/21, MD    ROS:  Out of a complete 14 system review of  symptoms, the patient complains only of the following symptoms, and all other reviewed systems are negative.  Leg weakness  Blood pressure 102/68, pulse 97, height 5\' 9"  (1.753 m), weight 173 lb (78.5 kg).  Physical Exam  General: The patient is alert and cooperative at the time of the examination.  Skin: No significant peripheral edema is noted.   Neurologic Exam  Mental status: The patient is alert and oriented x 3 at the time of the examination. The patient has apparent normal recent and remote memory, with an apparently normal attention span and concentration ability.   Cranial nerves: Facial symmetry is present. Speech is normal, no aphasia or dysarthria is noted. Extraocular movements are full. Visual fields are  full.  Motor: The patient has good strength in all 4 extremities.  Sensory examination: Soft touch sensation is symmetric on the face, arms, and legs.  Coordination: The patient has good finger-nose-finger and heel-to-shin bilaterally.  Gait and station: The patient has a normal gait. Tandem gait is normal. Romberg is negative. No drift is seen.  A timed walk test with 6 laps of 20 feet was done in 25 seconds.  Reflexes: Deep tendon reflexes are symmetric.   MRI brain 09/15/20:  IMPRESSION: This MRI of the brain with and without contrast shows the following: 1.  T2/FLAIR hyperintense foci in the hemispheres and brainstem in a pattern consistent with chronic demyelinating plaque associated with multiple sclerosis.  None of the foci appear to be acute.  They do not enhance.  Compared to the MRI dated 09/11/2018, there are no new lesions. 2.   No acute findings.  Normal enhancement pattern.   * MRI scan images were reviewed online. I agree with the written report.    Assessment/Plan:  1.  Multiple sclerosis  The patient has been on Ampyra in the past and wishes to go back on this for the leg fatigue, I will initiate this.  The patient clinically has no significant deficits.  We will follow his issues over time.  I recommended MRI of the cervical spine but he does not wish to do this.  He will follow-up in 6 months with Dr. 11/11/2018.  I would not place him back on a disease modifying agent at this time.  If there is ongoing progression of leg symptoms, a medication such as Ocrevus or Mayzent can be used.  Greater than 50% of the visit was spent in counseling and coordination of care.  Face-to-face time with the patient was 25 minutes.   Epimenio Foot MD 10/30/2020 3:35 PM  [facility] [address] [place], [place]  Phone [phone] Fax [phone]

## 2020-10-31 ENCOUNTER — Other Ambulatory Visit (HOSPITAL_COMMUNITY): Payer: Self-pay

## 2020-11-05 ENCOUNTER — Other Ambulatory Visit (HOSPITAL_COMMUNITY): Payer: Self-pay

## 2020-11-05 MED FILL — Tamsulosin HCl Cap 0.4 MG: ORAL | 90 days supply | Qty: 180 | Fill #1 | Status: AC

## 2020-12-12 ENCOUNTER — Other Ambulatory Visit (HOSPITAL_COMMUNITY): Payer: Self-pay

## 2020-12-13 ENCOUNTER — Other Ambulatory Visit (HOSPITAL_COMMUNITY): Payer: Self-pay

## 2021-01-07 ENCOUNTER — Other Ambulatory Visit (HOSPITAL_COMMUNITY): Payer: Self-pay

## 2021-01-07 ENCOUNTER — Telehealth: Payer: Self-pay | Admitting: Neurology

## 2021-01-07 NOTE — Telephone Encounter (Signed)
Called and spoke w/ pt.  He has new insurance effective: 12/11/20. He provided me info below. Aware we will work on [place]. He states it is not urgent.  [employer] [facility] ID: [id number] RxGroup: [id number] RxPCN: [id number] RxBIN: [id number] Provider phone#: [phone]  Reminded him that next appt is 05/02/21 at 4pm w/ Dr. Epimenio Foot.    Submitted PA on CMM. Key: [id number]. Waiting on determination from [employer] Part D.

## 2021-01-07 NOTE — Telephone Encounter (Signed)
Pt called, received letter from [facility] need a PA for .modafinil (PROVIGIL) 200 MG tablet Would like a call from the nurse.

## 2021-01-08 NOTE — Telephone Encounter (Signed)
Request Reference Number: [id number]. MODAFINIL TAB 200MG  is approved through 08/10/2021. Your patient may now fill this prescription and it will be covered.

## 2021-01-12 ENCOUNTER — Other Ambulatory Visit (HOSPITAL_COMMUNITY): Payer: Self-pay

## 2021-01-14 ENCOUNTER — Other Ambulatory Visit (HOSPITAL_COMMUNITY): Payer: Self-pay

## 2021-01-14 MED ORDER — LOSARTAN POTASSIUM 100 MG PO TABS
100.0000 mg | ORAL_TABLET | Freq: Every day | ORAL | 2 refills | Status: DC
  Filled 2021-01-14: qty 90, 90d supply, fill #0
  Filled 2021-04-15: qty 90, 90d supply, fill #1

## 2021-01-15 ENCOUNTER — Other Ambulatory Visit (HOSPITAL_COMMUNITY): Payer: Self-pay

## 2021-01-17 IMAGING — CR DG LUMBAR SPINE 2-3V
3 series · 3 of 3 positions shown · non-contrast
Comparison: CT from 10/26/2016

CLINICAL DATA: Left low back pain, no known injury, initial
encounter

EXAM:
LUMBAR SPINE - 2-3 VIEW

[w lumbar spine ap]
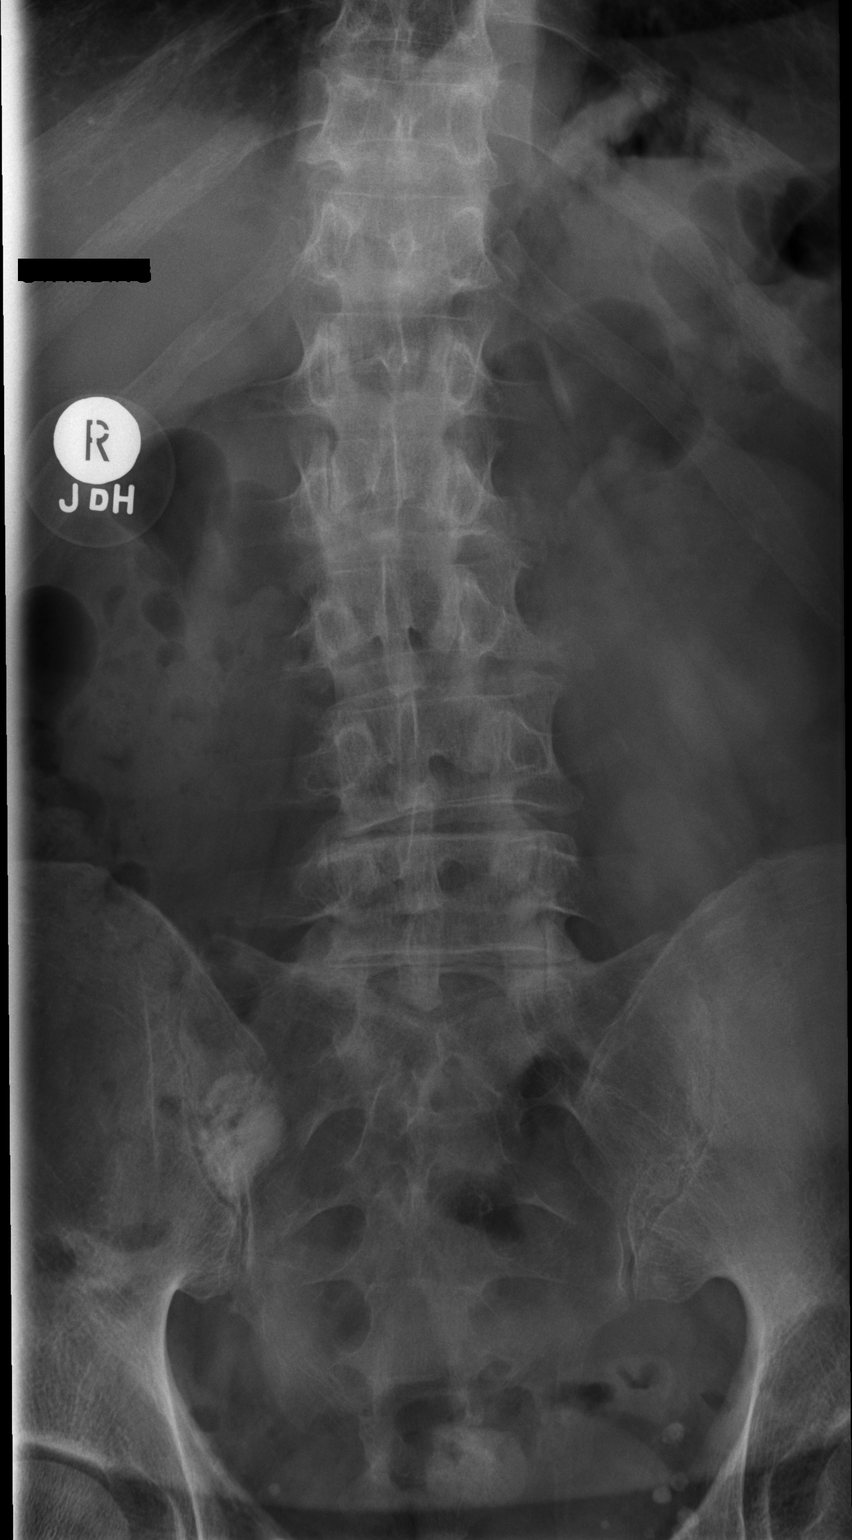

[w lumbar spine lat]
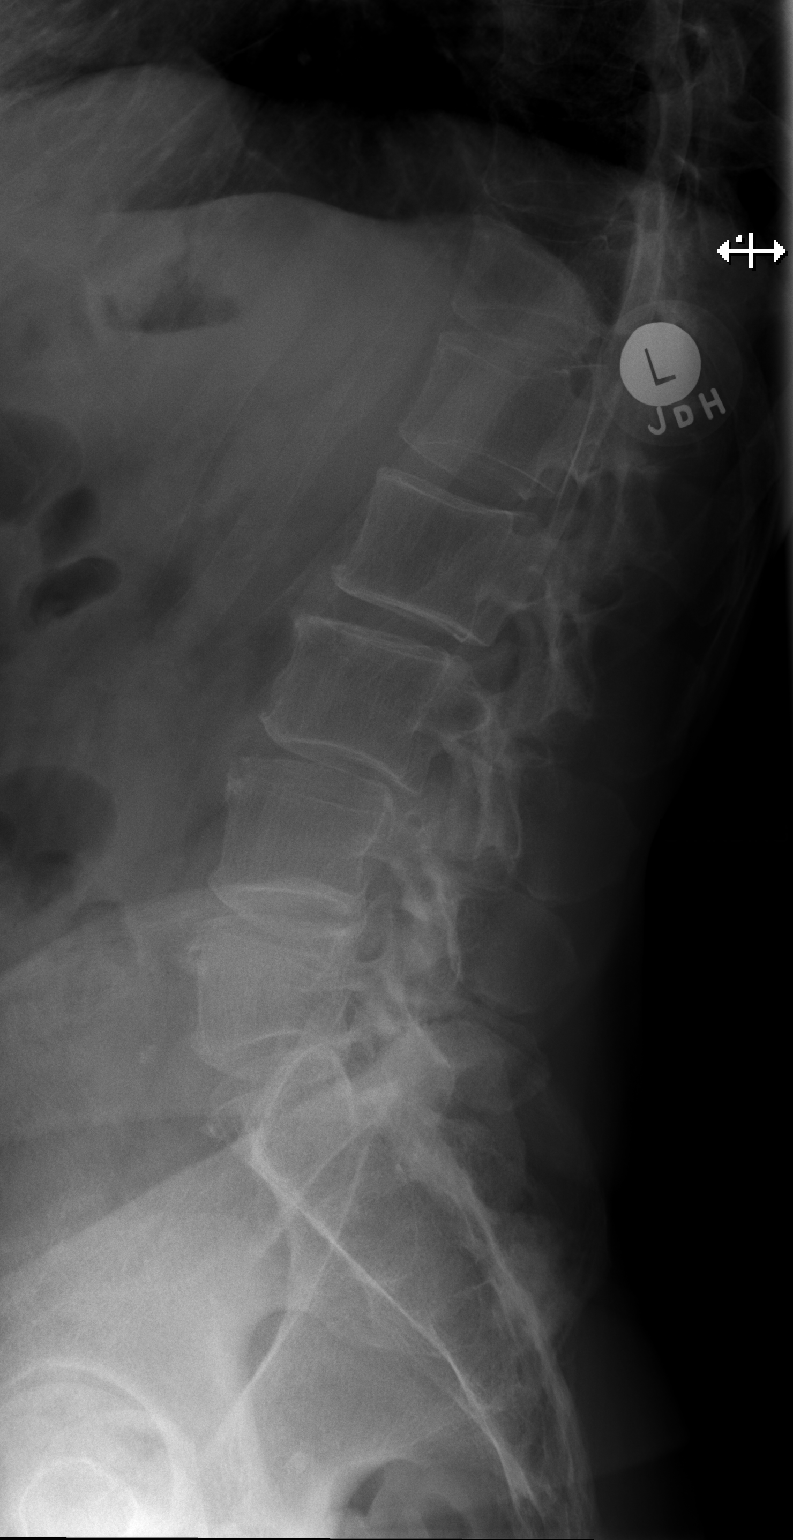

[w lumbar l-5 s-1 spot]
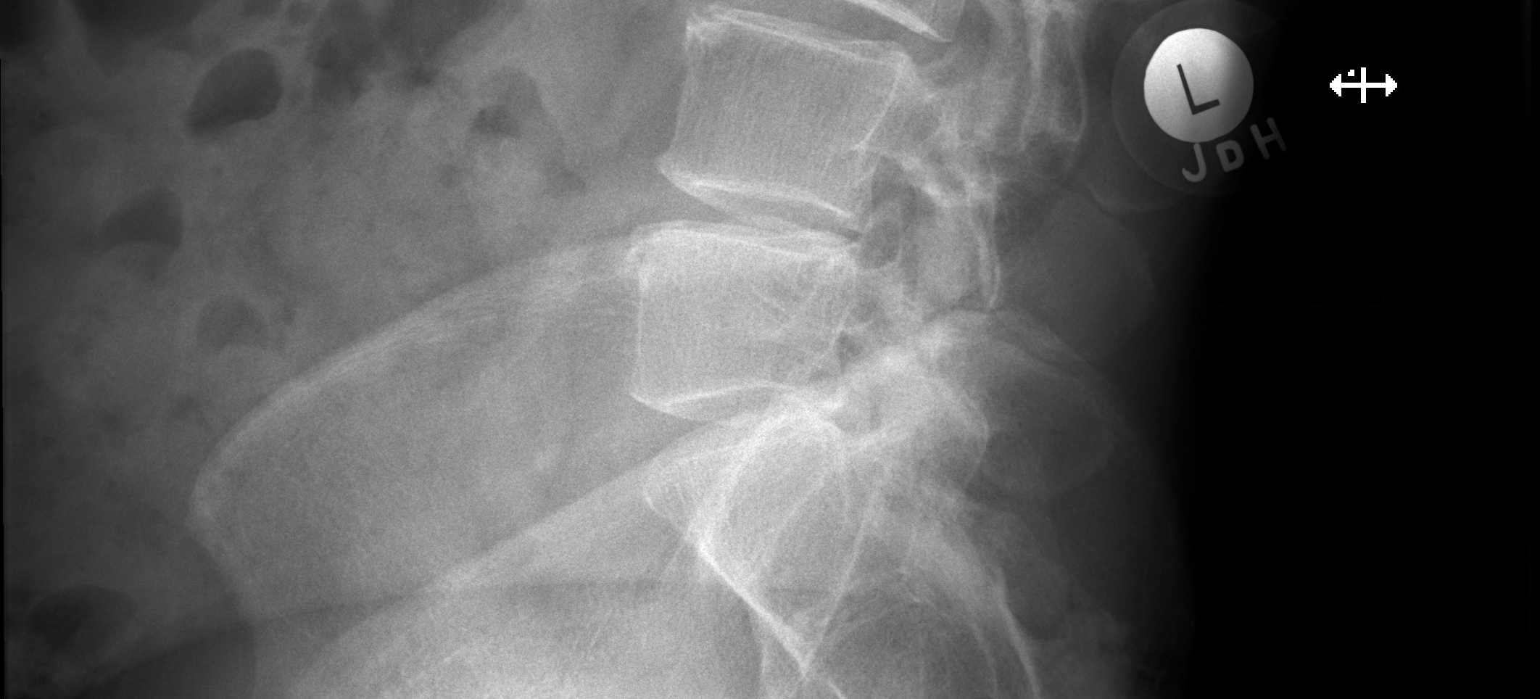

[3 of 3 positions shown; findings below may reference images not displayed]

FINDINGS: Five lumbar type vertebral bodies are well visualized. Vertebral
body height is well maintained. Mild osteophytic changes are seen.
No anterolisthesis is noted. Stable sclerosis is noted within the
iliac bone on the right posteriorly similar to that seen on the
prior CT examination consistent with a benign etiology. No acute
abnormality is seen.
IMPRESSION: Degenerative change without acute abnormality.

## 2021-01-21 ENCOUNTER — Other Ambulatory Visit (HOSPITAL_COMMUNITY): Payer: Self-pay

## 2021-02-05 ENCOUNTER — Other Ambulatory Visit (HOSPITAL_COMMUNITY): Payer: Self-pay

## 2021-02-06 ENCOUNTER — Other Ambulatory Visit (HOSPITAL_COMMUNITY): Payer: Self-pay

## 2021-02-07 ENCOUNTER — Other Ambulatory Visit (HOSPITAL_COMMUNITY): Payer: Self-pay

## 2021-02-07 MED ORDER — TAMSULOSIN HCL 0.4 MG PO CAPS
0.4000 mg | ORAL_CAPSULE | Freq: Two times a day (BID) | ORAL | 2 refills | Status: DC
  Filled 2021-02-07: qty 180, 90d supply, fill #0

## 2021-02-13 ENCOUNTER — Other Ambulatory Visit: Payer: Self-pay | Admitting: Neurology

## 2021-02-13 ENCOUNTER — Telehealth: Payer: Self-pay | Admitting: Neurology

## 2021-02-13 MED ORDER — MODAFINIL 200 MG PO TABS: 200.0000 mg | ORAL_TABLET | Freq: Every day | ORAL | 0 refills | Status: DC

## 2021-02-13 NOTE — Telephone Encounter (Signed)
I have routed this request to Dr Sater for review. The pt is due for the medication and [place] registry was verified.  

## 2021-02-13 NOTE — Telephone Encounter (Signed)
Pt is requesting a refill for modafinil (PROVIGIL) 200 MG tablet .  Pharmacy:  [facility] [id number]

## 2021-04-03 ENCOUNTER — Other Ambulatory Visit (HOSPITAL_COMMUNITY): Payer: Self-pay

## 2021-04-03 ENCOUNTER — Other Ambulatory Visit: Payer: Self-pay | Admitting: Neurology

## 2021-04-03 DIAGNOSIS — G35 Multiple sclerosis: Secondary | ICD-10-CM

## 2021-04-03 MED ORDER — ZOLPIDEM TARTRATE 10 MG PO TABS
ORAL_TABLET | Freq: Every day | ORAL | 0 refills | Status: DC
  Filled 2021-04-03: qty 90, fill #0
  Filled 2021-04-05: qty 90, 90d supply, fill #0

## 2021-04-03 NOTE — Telephone Encounter (Signed)
Dr. Teresa Coombs- can you please e-scribe?  Previous Dr. Anne Hahn pt. Dr. Epimenio Foot has not seen him yet but will be taking over care per last Willis note. Pt last seen 10/30/20 by Dr. Anne Hahn. Next appt scheduled for 05/02/21 with Dr. Epimenio Foot.  Checked drug registry. Last refilled 01/07/21 #90.

## 2021-04-03 NOTE — Telephone Encounter (Signed)
Pt request refill for zolpidem (AMBIEN) 10 MG tablet at [facility]

## 2021-04-05 ENCOUNTER — Other Ambulatory Visit (HOSPITAL_COMMUNITY): Payer: Self-pay

## 2021-04-08 ENCOUNTER — Other Ambulatory Visit (HOSPITAL_COMMUNITY): Payer: Self-pay

## 2021-04-15 ENCOUNTER — Other Ambulatory Visit (HOSPITAL_COMMUNITY): Payer: Self-pay

## 2021-04-15 MED ORDER — LOSARTAN POTASSIUM 100 MG PO TABS
100.0000 mg | ORAL_TABLET | Freq: Every day | ORAL | 3 refills | Status: DC
  Filled 2021-04-15 – 2021-07-09 (×2): qty 90, 90d supply, fill #0
  Filled 2021-10-08: qty 90, 90d supply, fill #1
  Filled 2022-01-07: qty 90, 90d supply, fill #2

## 2021-04-15 MED ORDER — MYRBETRIQ 50 MG PO TB24
ORAL_TABLET | ORAL | 4 refills | Status: DC
  Filled 2021-04-15: qty 90, 90d supply, fill #0
  Filled 2021-04-22: qty 30, 30d supply, fill #0

## 2021-04-17 ENCOUNTER — Other Ambulatory Visit (HOSPITAL_COMMUNITY): Payer: Self-pay

## 2021-04-17 MED ORDER — FESOTERODINE FUMARATE ER 4 MG PO TB24
4.0000 mg | ORAL_TABLET | Freq: Every day | ORAL | 3 refills | Status: DC
  Filled 2021-04-17 – 2021-04-26 (×4): qty 30, 30d supply, fill #0

## 2021-04-18 ENCOUNTER — Other Ambulatory Visit (HOSPITAL_COMMUNITY): Payer: Self-pay

## 2021-04-22 ENCOUNTER — Other Ambulatory Visit (HOSPITAL_COMMUNITY): Payer: Self-pay

## 2021-04-24 ENCOUNTER — Other Ambulatory Visit (HOSPITAL_COMMUNITY): Payer: Self-pay

## 2021-04-26 ENCOUNTER — Other Ambulatory Visit (HOSPITAL_COMMUNITY): Payer: Self-pay

## 2021-04-30 ENCOUNTER — Other Ambulatory Visit (HOSPITAL_COMMUNITY): Payer: Self-pay

## 2021-04-30 MED ORDER — FESOTERODINE FUMARATE ER 4 MG PO TB24
ORAL_TABLET | ORAL | 3 refills | Status: DC
  Filled 2021-04-30: qty 30, 30d supply, fill #0

## 2021-05-01 ENCOUNTER — Other Ambulatory Visit (HOSPITAL_COMMUNITY): Payer: Self-pay

## 2021-05-02 ENCOUNTER — Other Ambulatory Visit: Payer: Self-pay

## 2021-05-02 ENCOUNTER — Encounter: Payer: Self-pay | Admitting: Neurology

## 2021-05-02 ENCOUNTER — Other Ambulatory Visit (HOSPITAL_COMMUNITY): Payer: Self-pay

## 2021-05-02 ENCOUNTER — Ambulatory Visit (INDEPENDENT_AMBULATORY_CARE_PROVIDER_SITE_OTHER): Payer: Medicare Other | Admitting: Neurology

## 2021-05-02 VITALS — BP 127/95 | HR 96 | Ht 69.0 in | Wt 176.5 lb

## 2021-05-02 DIAGNOSIS — M21372 Foot drop, left foot: Secondary | ICD-10-CM | POA: Diagnosis not present

## 2021-05-02 DIAGNOSIS — R29898 Other symptoms and signs involving the musculoskeletal system: Secondary | ICD-10-CM

## 2021-05-02 DIAGNOSIS — G35 Multiple sclerosis: Secondary | ICD-10-CM | POA: Diagnosis not present

## 2021-05-02 DIAGNOSIS — G4700 Insomnia, unspecified: Secondary | ICD-10-CM

## 2021-05-02 DIAGNOSIS — R5383 Other fatigue: Secondary | ICD-10-CM

## 2021-05-02 MED ORDER — AMANTADINE HCL 100 MG PO CAPS
100.0000 mg | ORAL_CAPSULE | Freq: Two times a day (BID) | ORAL | 11 refills | Status: DC
  Filled 2021-05-02: qty 60, 30d supply, fill #0
  Filled 2021-06-10: qty 60, 30d supply, fill #1
  Filled 2021-07-09: qty 60, 30d supply, fill #2

## 2021-05-02 MED ORDER — MODAFINIL 200 MG PO TABS
200.0000 mg | ORAL_TABLET | Freq: Every day | ORAL | 1 refills | Status: DC
  Filled 2021-05-02: qty 90, 90d supply, fill #0

## 2021-05-02 MED ORDER — ALPRAZOLAM 0.5 MG PO TABS
0.5000 mg | ORAL_TABLET | Freq: Every day | ORAL | 0 refills | Status: DC | PRN
  Filled 2021-05-02: qty 25, 25d supply, fill #0

## 2021-05-02 MED ORDER — ZOLPIDEM TARTRATE 10 MG PO TABS
ORAL_TABLET | Freq: Every day | ORAL | 1 refills | Status: DC
  Filled 2021-05-02: qty 90, fill #0
  Filled 2021-07-02: qty 90, 90d supply, fill #0
  Filled 2021-09-30: qty 90, 90d supply, fill #1

## 2021-05-02 NOTE — Progress Notes (Signed)
? ?[facility] ? ?PATIENT: Antonio Cook ?DOB: 04-23-1955 ? ?REFERRING DOCTOR OR PCP:  Dr. Felipa Eth ?SOURCE: Patient, notes from Dr. Anne Hahn, imaging and laboratory reports, MRI images personally reviewed. ? ?_________________________________ ? ? ?HISTORICAL ? ?CHIEF COMPLAINT:  ?Chief Complaint  ?Patient presents with  ? Follow-up  ?  Rm 1, alone. Here for 6 month MS f/u, off DMT. Pt reports doing well overall. Pt continues to have leg weakness.   ? ? ?HISTORY OF PRESENT ILLNESS:  ? 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   ? ?UPDATE 05/02/2021 ?MS history: ?In 2002, he woke up with reduced right hearing.   He saw his PCP and was referred to ann ENT but was foud to have normal hearing.   An MRI was performed showing chages c/w MS.  He did not have other symptoms since then.    He saw Dr. Sandria Manly and was found to have a normal exam.   Rebif was started.   He saw Dr. Leotis Shames who felt he had a more aggressive MS.    He opted to continue with Dr. Sandria Manly and on Rebif.   He switched to Dr, Anne Hahn 7-8  years ago and had an episode of left leg weakness 4 years ago.     He had another opinion from Dr. Gaynelle Adu a couple years ago.   He did try Copaxone for a short try.     He went off of medication and has had a stable MRI.    He never had an exacerbation.   Until recently he was very stable.    He notes doing well as long as he gets a good night sleep helped by Ambien.  Due to sleepiness, he takes Provigil with benefit.   He is also on Magnesium for cramps.   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   He mps some by the end of the day but does well in the mornings.  Ampyra had not helped.    ? ?Currently, his main concern is that he notes that the left leg gets weak as the day goes on or if he walks more.   He is planning a trip to [place] and concerned about the left leg / gait issues.       He has some urinary urgency and has had some urge incontinence.  He used to  have nocturia but not now.   He goes about every 2 hours.   Mood and cognition are doing well.  He has had insomnia but sleeps well with Ambien and tolerates it well.  Fatigue does well on Provigil. ?  ?MRI Review ?MRI of the head 09/15/2020 shows mild generalized cortical atrophy.  T2/FLAIR hyperintense foci in the left lateral upper pons and middle cerebellar peduncles..  There are T2/FLAIR hyperintense foci in the periventricular, juxtacortical and deep white matter of both hemispheres.  None of the foci appear to be acute.  They do not enhance.  Compared to the MRI from 10/24/2014, there were no definite changes. ? ?MRI of the cervical spine 10/24/2014 shows T2 hyperintense foci adjacent to C1-C2 and T3.  There was a normal enhancement pattern.  Mild disc bulges in the upper cervical spine but no nerve root compression. ? ?REVIEW OF SYSTEMS: ?Constitutional: No fevers, chills, sweats, or change in appetite ?Eyes: No visual changes, double vision, eye pain ?Ear, nose and throat: No hearing loss, ear pain, nasal congestion, sore  throat ?Cardiovascular: No chest pain, palpitations ?Respiratory:  No shortness of breath at rest or with exertion.   No wheezes ?GastrointestinaI: No nausea, vomiting, diarrhea, abdominal pain, fecal incontinence ?Genitourinary:  No dysuria, urinary retention or frequency.  No nocturia. ?Musculoskeletal:  No neck pain, back pain ?Integumentary: No rash, pruritus, skin lesions ?Neurological: as above ?Psychiatric: No depression at this time.  No anxiety ?Endocrine: No palpitations, diaphoresis, change in appetite, change in weigh or increased thirst ?Hematologic/Lymphatic:  No anemia, purpura, petechiae. ?Allergic/Immunologic: No itchy/runny eyes, nasal congestion, recent allergic reactions, rashes ? ?ALLERGIES: ?Allergies  ?Allergen Reactions  ? Gadolinium Derivatives Nausea And Vomiting  ?  Pt vomited after injection.   ? Hctz [Hydrochlorothiazide] Rash  ? ? ?HOME MEDICATIONS: ? ?Current  Outpatient Medications:  ?  Cholecalciferol 25 MCG (1000 UT) tablet, Take by mouth., Disp: , Rfl:  ?  dalfampridine (AMPYRA) 10 MG TB12, Take 1 tablet (10 mg total) by mouth 2 (two) times daily., Disp: 60 tablet, Rfl: 3 ?  fesoterodine (TOVIAZ) 4 MG TB24 tablet, Take 1 tablet by mouth once a day, Disp: 30 tablet, Rfl: 3 ?  losartan (COZAAR) 100 MG tablet, TAKE 1 TABLET BY MOUTH AT BEDTIME, Disp: 90 tablet, Rfl: 3 ?  Magnesium 250 MG TABS, Take 250 mg by mouth., Disp: , Rfl:  ?  modafinil (PROVIGIL) 200 MG tablet, Take 1 tablet by mouth daily., Disp: 90 tablet, Rfl: 0 ?  ondansetron (ZOFRAN) 4 MG tablet, Take 1 tablet (4 mg total) by mouth every 8 (eight) hours as needed for nausea or vomiting., Disp: 5 tablet, Rfl: 0 ?  tamsulosin (FLOMAX) 0.4 MG CAPS capsule, TAKE 1 CAPSULE BY MOUTH 2 TIMES DAILY, Disp: 180 capsule, Rfl: 2 ?  zolpidem (AMBIEN) 10 MG tablet, TAKE 1 TABLET BY MOUTH AT BEDTIME, Disp: 90 tablet, Rfl: 0 ? ?PAST MEDICAL HISTORY: ?Past Medical History:  ?Diagnosis Date  ? Hypertension   ? MS (multiple sclerosis) (HCC)   ? ? ?PAST SURGICAL HISTORY: ?Past Surgical History:  ?Procedure Laterality Date  ? ACHILLES TENDON REPAIR Left   ? ? ?FAMILY HISTORY: ?Family History  ?Problem Relation Age of Onset  ? Lymphoma Mother   ? Cancer Father   ? Cancer Maternal Grandfather   ? Multiple sclerosis Neg Hx   ? ? ?SOCIAL HISTORY: ? ?Social History  ? ?Socioeconomic History  ? Marital status: Married  ?  Spouse name: Not on file  ? Number of children: 2  ? Years of education: Not on file  ? Highest education level: Not on file  ?Occupational History  ? Occupation: Physician  ?Tobacco Use  ? Smoking status: Never  ? Smokeless tobacco: Never  ?Substance and Sexual Activity  ? Alcohol use: No  ? Drug use: No  ? Sexual activity: Not on file  ?Other Topics Concern  ? Not on file  ?Social History Narrative  ? Patient drinks 1 cup of caffeine daily.  ? Patient is right handed.  ? ?Social Determinants of Health  ? ?Financial  Resource Strain: Not on file  ?Food Insecurity: Not on file  ?Transportation Needs: Not on file  ?Physical Activity: Not on file  ?Stress: Not on file  ?Social Connections: Not on file  ?Intimate Partner Violence: Not on file  ? ? ? ?PHYSICAL EXAM ? ?Vitals:  ? 05/02/21 1546  ?BP: (!) 127/95  ?Pulse: 96  ?Weight: 176 lb 8 oz (80.1 kg)  ?Height: 5\' 9"  (1.753 m)  ? ? ?Body mass index is 26.06  kg/m?. ? ? ?General: The patient is well-developed and well-nourished and in no acute distress ? ?HEENT:  Head is [place]/AT.  Sclera are anicteric.   ? ?Neck: No carotid bruits are noted.  The neck is nontender. ? ?Cardiovascular: The heart has a regular rate and rhythm with a normal S1 and S2. There were no murmurs, gallops or rubs.   ? ?Skin: Extremities are without rash or  edema. ? ?Musculoskeletal:  Back is nontender ? ?Neurologic Exam ? ?Mental status: The patient is alert and oriented x 3 at the time of the examination. The patient has apparent normal recent and remote memory, with an apparently normal attention span and concentration ability.   Speech is normal. ? ?Cranial nerves: Extraocular movements are full.  Color vision was normal.  There is good facial sensation to soft touch bilaterally.Facial strength is normal.  Trapezius and sternocleidomastoid strength is normal. No dysarthria is noted.  The tongue is midline, and the patient has symmetric elevation of the soft palate. No obvious hearing deficits are noted. ? ?Motor:  Muscle bulk is normal.   Tone is normal. Strength is  5 / 5 in all 4 extremities.  ? ?Sensory: Sensory testing is intact to pinprick, soft touch and vibration sensation in all 4 extremities. ? ?Coordination: Cerebellar testing reveals good finger-nose-finger and heel-to-shin bilaterally. ? ?Gait and station: Station is normal.   Gait is mildly wide and shows a minimal left foot drop. Tandem gait is wide. Romberg is negative.  ? ?Reflexes: Deep tendon reflexes are symmetric and normal in the arms but  increased in the legs with crossed abductors at the knees.  There was no ankle clonus..   Plantar responses are flexor. ? ? ? ?DIAGNOSTIC DATA (LABS, IMAGING, TESTING) ?- I reviewed patient records, labs,

## 2021-05-03 ENCOUNTER — Telehealth: Payer: Self-pay | Admitting: Neurology

## 2021-05-03 NOTE — Telephone Encounter (Signed)
medicare/bcbs supp order sent to GI, they will reach out to the patient to schedule.  ?

## 2021-05-04 ENCOUNTER — Other Ambulatory Visit (HOSPITAL_COMMUNITY): Payer: Self-pay

## 2021-05-06 ENCOUNTER — Other Ambulatory Visit (HOSPITAL_COMMUNITY): Payer: Self-pay

## 2021-05-07 ENCOUNTER — Other Ambulatory Visit (HOSPITAL_COMMUNITY): Payer: Self-pay

## 2021-05-08 ENCOUNTER — Other Ambulatory Visit (HOSPITAL_COMMUNITY): Payer: Self-pay

## 2021-05-08 MED ORDER — BOOSTRIX 5-2.5-18.5 LF-MCG/0.5 IM SUSY
PREFILLED_SYRINGE | INTRAMUSCULAR | 0 refills | Status: DC
  Filled 2021-05-08: qty 0.5, 1d supply, fill #0

## 2021-05-14 ENCOUNTER — Other Ambulatory Visit (HOSPITAL_COMMUNITY): Payer: Self-pay

## 2021-05-27 ENCOUNTER — Other Ambulatory Visit: Payer: Self-pay | Admitting: Neurology

## 2021-05-28 NOTE — Telephone Encounter (Signed)
Last OV was on 05/02/21.  ?Next OV is scheduled for 11/14/21.  ?Last RX was written on 02/14/21 for 90 tabs.  ? ?[place] [facility] Database has been reviewed.  ?

## 2021-06-10 ENCOUNTER — Other Ambulatory Visit (HOSPITAL_COMMUNITY): Payer: Self-pay

## 2021-06-15 ENCOUNTER — Ambulatory Visit
Admission: RE | Admit: 2021-06-15 | Discharge: 2021-06-15 | Disposition: A | Discharge status: Home / Self Care | Payer: Medicare Other | Source: Ambulatory Visit | Attending: Neurology | Admitting: Neurology

## 2021-06-15 DIAGNOSIS — R29898 Other symptoms and signs involving the musculoskeletal system: Secondary | ICD-10-CM | POA: Diagnosis not present

## 2021-06-15 DIAGNOSIS — G35 Multiple sclerosis: Secondary | ICD-10-CM | POA: Diagnosis not present

## 2021-06-17 ENCOUNTER — Telehealth: Payer: Self-pay | Admitting: Neurology

## 2021-06-17 NOTE — Telephone Encounter (Signed)
Pt would like to discuss results of Cervical and Thoracic scan. He has questions about some of the details in his results and would like a call back.  ?

## 2021-06-17 NOTE — Telephone Encounter (Signed)
Called and LVM letting pt know Dr. Epimenio Foot out this week and Monday next week. I have sent results to covering MD to see if they can review in his absence. Will call back once we hear from covering physician. ?

## 2021-06-17 NOTE — Telephone Encounter (Signed)
Dr. Epimenio Foot out this week. I have forwarded MRI results to covering MD this am, Dr. Vickey Huger to review when she has a chance.  ?

## 2021-06-18 ENCOUNTER — Encounter: Payer: Self-pay | Admitting: Neurology

## 2021-06-19 NOTE — Telephone Encounter (Signed)
Called and spoke with pt. He received results spoke with radiologist about results as well. Did not want to go over results verbally with me since he saw results online. Would like Dr. Epimenio Foot to call him next week upon his return to talk about next steps.  ?

## 2021-06-24 ENCOUNTER — Encounter: Payer: Self-pay | Admitting: Neurology

## 2021-06-24 NOTE — Telephone Encounter (Signed)
I personally reviewed the MRIs of the cervical and thoracic spine from 06/15/2021 and compared them to the MRI of the cervical spine from 10/24/2014. ? ?The MRI of the cervical spine showed no definite cervical plaques.  A focus noted adjacent to C2 on the previous MRI is not readily apparent on the current MRI.  There are degenerative changes at C3-C4 and C4-C5 with moderately severe right foraminal narrowing at C3-C4 with potential to affect the right C4 nerve root. ? ?The MRI of the thoracic spine shows a small central focus at T2-T3, a small left focus at T3 and a small central focus at T4.  The T2-T3 and T3 lesions can also be seen on the 2016 MRI of the cervical spine (it did not go low enough to cover the T4 level). ? ?I called and left a message and will try to reach him again this week. ?

## 2021-06-25 NOTE — Telephone Encounter (Signed)
I spoke to Dr. Donell Beers to go over the MRI results.  I do not think that there was any new MS plaque ?

## 2021-07-02 ENCOUNTER — Other Ambulatory Visit (HOSPITAL_COMMUNITY): Payer: Self-pay

## 2021-07-09 ENCOUNTER — Other Ambulatory Visit (HOSPITAL_COMMUNITY): Payer: Self-pay

## 2021-07-29 ENCOUNTER — Telehealth: Payer: Self-pay | Admitting: Neurology

## 2021-07-29 NOTE — Telephone Encounter (Signed)
Rescheduled 10/5 appointment with pt over the phone - MD out 

## 2021-08-31 ENCOUNTER — Other Ambulatory Visit: Payer: Self-pay | Admitting: Neurology

## 2021-09-02 NOTE — Telephone Encounter (Signed)
Last OV was on 05/02/21.  Next OV is scheduled for 11/28/21.  Last RX was written on 05/28/21 for 90 tabs.   [place] [facility] Database has been reviewed.

## 2021-09-30 ENCOUNTER — Other Ambulatory Visit (HOSPITAL_COMMUNITY): Payer: Self-pay

## 2021-10-08 ENCOUNTER — Other Ambulatory Visit (HOSPITAL_COMMUNITY): Payer: Self-pay

## 2021-11-14 ENCOUNTER — Ambulatory Visit: Payer: Medicare Other | Admitting: Neurology

## 2021-11-28 ENCOUNTER — Ambulatory Visit (INDEPENDENT_AMBULATORY_CARE_PROVIDER_SITE_OTHER): Payer: Medicare Other | Admitting: Neurology

## 2021-11-28 ENCOUNTER — Other Ambulatory Visit (HOSPITAL_COMMUNITY): Payer: Self-pay

## 2021-11-28 ENCOUNTER — Encounter: Payer: Self-pay | Admitting: Neurology

## 2021-11-28 VITALS — BP 121/82 | HR 85 | Ht 69.0 in | Wt 169.0 lb

## 2021-11-28 DIAGNOSIS — R5383 Other fatigue: Secondary | ICD-10-CM

## 2021-11-28 DIAGNOSIS — R29898 Other symptoms and signs involving the musculoskeletal system: Secondary | ICD-10-CM | POA: Diagnosis not present

## 2021-11-28 DIAGNOSIS — G35 Multiple sclerosis: Secondary | ICD-10-CM

## 2021-11-28 DIAGNOSIS — M21372 Foot drop, left foot: Secondary | ICD-10-CM

## 2021-11-28 DIAGNOSIS — G4700 Insomnia, unspecified: Secondary | ICD-10-CM | POA: Diagnosis not present

## 2021-11-28 MED ORDER — ZOLPIDEM TARTRATE 10 MG PO TABS
10.0000 mg | ORAL_TABLET | Freq: Every day | ORAL | 1 refills | Status: DC
  Filled 2021-11-28: qty 90, fill #0
  Filled 2021-12-27: qty 90, 90d supply, fill #0
  Filled 2022-03-27: qty 90, 90d supply, fill #1

## 2021-11-28 MED ORDER — MODAFINIL 200 MG PO TABS
200.0000 mg | ORAL_TABLET | Freq: Every day | ORAL | 1 refills | Status: DC
  Filled 2021-11-28 – 2021-11-30 (×2): qty 90, 90d supply, fill #0
  Filled 2021-11-30: qty 60, 60d supply, fill #0
  Filled 2021-12-02 – 2021-12-03 (×2): qty 30, 30d supply, fill #0
  Filled 2021-12-03: qty 60, 60d supply, fill #0

## 2021-11-28 NOTE — Progress Notes (Signed)
[facility]  PATIENT: Antonio Cook DOB: Jul 31, 1955  REFERRING DOCTOR OR PCP:  Dr. Dagmar Hait SOURCE: Patient, notes from Dr. Jannifer Franklin, imaging and laboratory reports, MRI images personally reviewed.  _________________________________   HISTORICAL  CHIEF COMPLAINT:  Chief Complaint  Patient presents with   Follow-up    Pt in room #2 and alone. Pt here today for f/u on dalfamprideine for MS.    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    UPDATE 11/29/2021 He feels the MS is mostly stable though gait has slightly progressed.   His legs are mildly weaker.   We had tried amantadine and also ampyra but they were ineffective  He has left > right leg weakness.  He is trying to exercise more and is trying to build up the left leg more.    The left leg gets weaker as the day goes on or if he walks more.   He has some urinary urgency and has had some urge incontinence. He sometimes has leakage but no frank incontinence.    He used to have nocturia but not now.     Mood and cognition are doing well.  He has had insomnia but sleeps well with Ambien and tolerates it well.  Fatigue does well on Provigil.  He is working full time and stays active as a Teacher, music.   He mps some by the end of the day but does well in the mornings.  Ampyra had not helped.   MS history: In 2002, he woke up with reduced right hearing.   He saw his PCP and was referred to ann ENT but was foud to have normal hearing.   An MRI was performed showing chages c/w MS.  He did not have other symptoms since then.    He saw Dr. Erling Cruz and was found to have a normal exam.   Rebif was started.   He saw Dr. Jacqulynn Cadet who felt he had a more aggressive MS.    He opted to continue with Dr. Erling Cruz and on Rebif.   He switched to Dr, Jannifer Franklin 7-8  years ago and had an episode of left leg weakness 4 years ago.     He had another opinion from Dr. George Hugh a couple years ago.    He did try Copaxone for a short try.     He went off of medication and has had a stable MRI.    He never had an exacerbation.   Until recently he was very stable.    He notes doing well as long as he gets a good night sleep helped by Ambien.       MRI Review MRI of the head 09/15/2020 shows mild generalized cortical atrophy.  T2/FLAIR hyperintense foci in the left lateral upper pons and middle cerebellar peduncles..  There are T2/FLAIR hyperintense foci in the periventricular, juxtacortical and deep white matter of both hemispheres.  None of the foci appear to be acute.  They do not enhance.  Compared to the MRI from 10/24/2014, there were no definite changes.  MRI of the cervical spine 10/24/2014 shows T2 hyperintense foci adjacent to C1-C2 and T3.  There was a normal enhancement pattern.  Mild disc bulges in the upper cervical spine but no nerve root compression.  REVIEW OF SYSTEMS: Constitutional: No fevers, chills, sweats, or change in appetite Eyes: No visual changes, double vision, eye pain Ear, nose and throat:  No hearing loss, ear pain, nasal congestion, sore throat Cardiovascular: No chest pain, palpitations Respiratory:  No shortness of breath at rest or with exertion.   No wheezes GastrointestinaI: No nausea, vomiting, diarrhea, abdominal pain, fecal incontinence Genitourinary:  No dysuria, urinary retention or frequency.  No nocturia. Musculoskeletal:  No neck pain, back pain Integumentary: No rash, pruritus, skin lesions Neurological: as above Psychiatric: No depression at this time.  No anxiety Endocrine: No palpitations, diaphoresis, change in appetite, change in weigh or increased thirst Hematologic/Lymphatic:  No anemia, purpura, petechiae. Allergic/Immunologic: No itchy/runny eyes, nasal congestion, recent allergic reactions, rashes  ALLERGIES: Allergies  Allergen Reactions   Gadolinium Derivatives Nausea And Vomiting    Pt vomited after injection.    Hctz  [Hydrochlorothiazide] Rash    HOME MEDICATIONS:  Current Outpatient Medications:    ALPRAZolam (XANAX) 0.5 MG tablet, Take 1 tablet (0.5 mg total) by mouth daily as needed for anxiety., Disp: 25 tablet, Rfl: 0   amantadine (SYMMETREL) 100 MG capsule, Take 1 capsule (100 mg total) by mouth 2 (two) times daily., Disp: 60 capsule, Rfl: 11   Cholecalciferol 25 MCG (1000 UT) tablet, Take by mouth., Disp: , Rfl:    losartan (COZAAR) 100 MG tablet, TAKE 1 TABLET BY MOUTH AT BEDTIME, Disp: 90 tablet, Rfl: 3   Magnesium 250 MG TABS, Take 250 mg by mouth., Disp: , Rfl:    ondansetron (ZOFRAN) 4 MG tablet, Take 1 tablet (4 mg total) by mouth every 8 (eight) hours as needed for nausea or vomiting., Disp: 5 tablet, Rfl: 0   tamsulosin (FLOMAX) 0.4 MG CAPS capsule, TAKE 1 CAPSULE BY MOUTH 2 TIMES DAILY, Disp: 180 capsule, Rfl: 2   Tdap (BOOSTRIX) 5-2.5-18.5 LF-MCG/0.5 injection, Inject into the muscle., Disp: 0.5 mL, Rfl: 0   modafinil (PROVIGIL) 200 MG tablet, Take 1 tablet (200 mg total) by mouth daily., Disp: 90 tablet, Rfl: 1   zolpidem (AMBIEN) 10 MG tablet, Take 1 tablet (10 mg total) by mouth at bedtime., Disp: 90 tablet, Rfl: 1  PAST MEDICAL HISTORY: Past Medical History:  Diagnosis Date   Hypertension    MS (multiple sclerosis) (HCC)     PAST SURGICAL HISTORY: Past Surgical History:  Procedure Laterality Date   ACHILLES TENDON REPAIR Left     FAMILY HISTORY: Family History  Problem Relation Age of Onset   Lymphoma Mother    Cancer Father    Cancer Maternal Grandfather    Multiple sclerosis Neg Hx     SOCIAL HISTORY:  Social History   Socioeconomic History   Marital status: Married    Spouse name: Not on file   Number of children: 2   Years of education: Not on file   Highest education level: Not on file  Occupational History   Occupation: Physician  Tobacco Use   Smoking status: Never   Smokeless tobacco: Never  Substance and Sexual Activity   Alcohol use: No   Drug  use: No   Sexual activity: Not on file  Other Topics Concern   Not on file  Social History Narrative   Patient drinks 1 cup of caffeine daily.   Patient is right handed.   Social Determinants of Health   Financial Resource Strain: Not on file  Food Insecurity: Not on file  Transportation Needs: Not on file  Physical Activity: Not on file  Stress: Not on file  Social Connections: Not on file  Intimate Partner Violence: Not on file     PHYSICAL EXAM  Vitals:   11/28/21 1554  BP: 121/82  Pulse: 85  Weight: 169 lb (76.7 kg)  Height: 5\' 9"  (1.753 m)    Body mass index is 24.96 kg/m.   General: The patient is well-developed and well-nourished and in no acute distress  HEENT:  Head is [place]/AT.  Sclera are anicteric.    Skin: Extremities are without rash or  edema.  Neurologic Exam  Mental status: The patient is alert and oriented x 3 at the time of the examination. The patient has apparent normal recent and remote memory, with an apparently normal attention span and concentration ability.   Speech is normal.  Cranial nerves: Extraocular movements are full.  Color vision was normal.  Patient strength and sensation was normal.  No dysarthria.  No obvious hearing deficits are noted.  Motor:  Muscle bulk is normal.   Tone is normal. Strength is  5 / 5 in all 4 extremities.   Sensory: Sensory testing is intact to pinprick, soft touch and vibration sensation in all 4 extremities.  Coordination: Cerebellar testing reveals good finger-nose-finger and heel-to-shin bilaterally.  Gait and station: Station is normal.   Gait is mildly wide and shows a mild left foot drop. Tandem gait is wide. Romberg is negative.   Reflexes: Deep tendon reflexes are symmetric and normal in the arms but increased in the legs with crossed abductors at the knees.  However, there was no ankle clonus.       DIAGNOSTIC DATA (LABS, IMAGING, TESTING) - I reviewed patient records, labs, notes, testing  and imaging myself where available.  Lab Results  Component Value Date   WBC 8.3 10/11/2014   HGB 13.9 10/11/2014   HCT 42.1 10/11/2014   MCV 86 10/11/2014   PLT 194 10/11/2014      Component Value Date/Time   NA 141 10/11/2014 1158   K 4.5 10/11/2014 1158   CL 102 10/11/2014 1158   CO2 22 10/11/2014 1158   GLUCOSE 106 (H) 10/11/2014 1158   BUN 21 10/11/2014 1158   CREATININE 1.10 10/26/2016 1210   CALCIUM 9.3 10/11/2014 1158   PROT 6.8 10/11/2014 1158   ALBUMIN 4.3 10/11/2014 1158   AST 14 10/11/2014 1158   ALT 15 10/11/2014 1158   ALKPHOS 67 10/11/2014 1158   BILITOT <0.2 10/11/2014 1158   GFRNONAA 76 10/11/2014 1158   GFRAA 87 10/11/2014 1158       ASSESSMENT AND PLAN  Weakness of left leg  Multiple sclerosis (HCC) - Plan: zolpidem (AMBIEN) 10 MG tablet  Left foot drop  Insomnia, unspecified type  Other fatigue   He will remain off of a disease modifying therapy. Continue modafinil for excessive daytime sleepiness and Ambien for insomnia at night Stay active and exercise as tolerated. Return in 1 year but call sooner if there are new or worsening neurologic symptoms.  Shawny Borkowski A. 10/13/2014, MD, [facility] 11/29/2021, 9:19 AM Certified in Neurology, Clinical Neurophysiology, Sleep Medicine and Neuroimaging  [facility] [address] [place], [place] [phone]

## 2021-11-29 ENCOUNTER — Other Ambulatory Visit (HOSPITAL_COMMUNITY): Payer: Self-pay

## 2021-11-30 ENCOUNTER — Other Ambulatory Visit (HOSPITAL_COMMUNITY): Payer: Self-pay

## 2021-12-02 ENCOUNTER — Other Ambulatory Visit (HOSPITAL_COMMUNITY): Payer: Self-pay

## 2021-12-03 ENCOUNTER — Other Ambulatory Visit (HOSPITAL_COMMUNITY): Payer: Self-pay

## 2021-12-03 ENCOUNTER — Other Ambulatory Visit: Payer: Self-pay | Admitting: Neurology

## 2021-12-03 ENCOUNTER — Telehealth: Payer: Self-pay | Admitting: Neurology

## 2021-12-03 MED ORDER — MODAFINIL 200 MG PO TABS: 200.0000 mg | ORAL_TABLET | Freq: Every day | ORAL | 1 refills | Status: DC

## 2021-12-03 NOTE — Telephone Encounter (Signed)
I have resent the script to Dr Felecia Shelling to resend to [facility]

## 2021-12-03 NOTE — Telephone Encounter (Signed)
Pt is calling. Stated his prescription for modafinil (PROVIGIL) 200 MG tablet was sent to the wrong pharmacy. Pt is requesting medication be sent to  [facility] [phone] .

## 2021-12-06 ENCOUNTER — Other Ambulatory Visit (HOSPITAL_COMMUNITY): Payer: Self-pay

## 2021-12-17 ENCOUNTER — Other Ambulatory Visit (HOSPITAL_COMMUNITY): Payer: Self-pay

## 2021-12-17 MED ORDER — CIPROFLOXACIN HCL 500 MG PO TABS
500.0000 mg | ORAL_TABLET | Freq: Two times a day (BID) | ORAL | 0 refills | Status: AC
  Filled 2021-12-17: qty 14, 7d supply, fill #0

## 2021-12-23 ENCOUNTER — Other Ambulatory Visit: Payer: Self-pay | Admitting: Internal Medicine

## 2021-12-23 DIAGNOSIS — R109 Unspecified abdominal pain: Secondary | ICD-10-CM

## 2021-12-25 ENCOUNTER — Other Ambulatory Visit (HOSPITAL_COMMUNITY): Payer: Self-pay

## 2021-12-26 ENCOUNTER — Ambulatory Visit
Admission: RE | Admit: 2021-12-26 | Discharge: 2021-12-26 | Disposition: A | Discharge status: Home / Self Care | Payer: Medicare Other | Source: Ambulatory Visit | Attending: Internal Medicine | Admitting: Internal Medicine

## 2021-12-26 DIAGNOSIS — R109 Unspecified abdominal pain: Secondary | ICD-10-CM

## 2021-12-26 MED ORDER — IOPAMIDOL (ISOVUE-300) INJECTION 61%
100.0000 mL | Freq: Once | INTRAVENOUS | Status: AC | PRN
  Administered 2021-12-26: 100 mL via INTRAVENOUS

## 2021-12-27 ENCOUNTER — Other Ambulatory Visit (HOSPITAL_COMMUNITY): Payer: Self-pay

## 2022-01-07 ENCOUNTER — Other Ambulatory Visit (HOSPITAL_COMMUNITY): Payer: Self-pay

## 2022-03-25 ENCOUNTER — Other Ambulatory Visit (HOSPITAL_COMMUNITY): Payer: Self-pay

## 2022-03-27 ENCOUNTER — Other Ambulatory Visit: Payer: Self-pay

## 2022-03-28 ENCOUNTER — Ambulatory Visit (INDEPENDENT_AMBULATORY_CARE_PROVIDER_SITE_OTHER): Payer: Medicare Other

## 2022-03-28 ENCOUNTER — Ambulatory Visit: Payer: Medicare Other | Admitting: Podiatry

## 2022-03-28 ENCOUNTER — Encounter: Payer: Self-pay | Admitting: Podiatry

## 2022-03-28 ENCOUNTER — Ambulatory Visit (INDEPENDENT_AMBULATORY_CARE_PROVIDER_SITE_OTHER): Payer: Medicare Other | Admitting: Podiatry

## 2022-03-28 DIAGNOSIS — M7750 Other enthesopathy of unspecified foot: Secondary | ICD-10-CM

## 2022-03-28 DIAGNOSIS — M7751 Other enthesopathy of right foot: Secondary | ICD-10-CM

## 2022-03-28 DIAGNOSIS — M722 Plantar fascial fibromatosis: Secondary | ICD-10-CM

## 2022-03-28 NOTE — Progress Notes (Signed)
Subjective:   Patient ID: Antonio Cook, male   DOB: 67 y.o.   MRN: [id number]   HPI Patient states he had a lot of pain in his right heel that has currently resolved but he is going on a trip and he was concerned about this.  Patient states that he has had this in the past and does not smoke likes to be active   Review of Systems  All other systems reviewed and are negative.       Objective:  Physical Exam Vitals and nursing note reviewed.  Constitutional:      Appearance: He is well-developed.  Pulmonary:     Effort: Pulmonary effort is normal.  Musculoskeletal:        General: Normal range of motion.  Skin:    General: Skin is warm.  Neurological:     Mental Status: He is alert.     Neurovascular status intact muscle strength adequate range of motion within normal limits with mild MS with weakness in his left leg of a mild nature.  Mild discomfort plantar aspect right heel but it is much better than it was where it had been quite inflamed     Assessment:  Plantar fasciitis right also with history of left leg weakness     Plan:  H&P x-ray reviewed went over this in great length discussed the structure of the foot and things he can do to try to prevent problems and if any issues were to occur organ to need to consider injection treatment.  At this point supportive therapy recommended  X-ray indicate minimal spur formation no loss of arch height or other pathology

## 2022-04-02 ENCOUNTER — Other Ambulatory Visit (HOSPITAL_COMMUNITY): Payer: Self-pay

## 2022-04-02 MED ORDER — TAMSULOSIN HCL 0.4 MG PO CAPS
0.4000 mg | ORAL_CAPSULE | Freq: Two times a day (BID) | ORAL | 3 refills | Status: DC
  Filled 2022-04-02 – 2023-02-16 (×3): qty 180, 90d supply, fill #0

## 2022-04-02 MED ORDER — LOSARTAN POTASSIUM 100 MG PO TABS
100.0000 mg | ORAL_TABLET | Freq: Every day | ORAL | 3 refills | Status: DC
  Filled 2022-04-02: qty 90, 90d supply, fill #0
  Filled 2022-07-04: qty 90, 90d supply, fill #1
  Filled 2022-09-28: qty 90, 90d supply, fill #2
  Filled 2022-12-30: qty 90, 90d supply, fill #3

## 2022-04-08 ENCOUNTER — Telehealth: Payer: Self-pay | Admitting: Neurology

## 2022-04-08 NOTE — Telephone Encounter (Signed)
Phone # not in service, sent [place] informing pt of r/s needed.

## 2022-04-24 ENCOUNTER — Other Ambulatory Visit (HOSPITAL_COMMUNITY): Payer: Self-pay

## 2022-05-06 ENCOUNTER — Other Ambulatory Visit (HOSPITAL_COMMUNITY): Payer: Self-pay

## 2022-05-06 DIAGNOSIS — H6122 Impacted cerumen, left ear: Secondary | ICD-10-CM | POA: Insufficient documentation

## 2022-05-06 MED ORDER — VALACYCLOVIR HCL 1 G PO TABS
ORAL_TABLET | ORAL | 0 refills | Status: DC
  Filled 2022-05-06: qty 21, 7d supply, fill #0

## 2022-05-12 ENCOUNTER — Ambulatory Visit (INDEPENDENT_AMBULATORY_CARE_PROVIDER_SITE_OTHER): Payer: Medicare Other | Admitting: Podiatry

## 2022-05-12 ENCOUNTER — Encounter: Payer: Self-pay | Admitting: Podiatry

## 2022-05-12 DIAGNOSIS — L03032 Cellulitis of left toe: Secondary | ICD-10-CM

## 2022-05-12 NOTE — Patient Instructions (Signed)

## 2022-05-14 NOTE — Progress Notes (Signed)
Subjective:   Patient ID: Antonio Cook, male   DOB: 67 y.o.   MRN: [id number]   HPI Patient presents with the fourth nail right being extremely sore with a acute localized infection lateral border that just arise   ROS      Objective:  Physical Exam  Neurovascular status intact with a 6 slightly red localized area of irritation fourth digit right foot lateral no proximal edema erythema drainage noted     Assessment:  Paronychia infection fourth digit lateral side     Plan:  Anesthetized 60 mg like Marcaine mixture sterile prep done using sterile instrumentation remove the lateral border cleaned up necrotic tissue allow channel for drainage instructed on soaks applied sterile dressing reappoint if symptoms continue

## 2022-05-20 ENCOUNTER — Telehealth: Payer: Self-pay | Admitting: Neurology

## 2022-05-20 NOTE — Telephone Encounter (Signed)
Pt called stating that he is needing to speak to the provider, that it is not urgent but would like to discuss some concerns he has. Pt did not give any other information. Please advise.

## 2022-06-04 ENCOUNTER — Other Ambulatory Visit (HOSPITAL_COMMUNITY): Payer: Self-pay

## 2022-06-05 ENCOUNTER — Ambulatory Visit: Payer: Medicare Other | Admitting: Neurology

## 2022-06-05 ENCOUNTER — Other Ambulatory Visit: Payer: Self-pay | Admitting: Neurology

## 2022-06-05 ENCOUNTER — Other Ambulatory Visit (HOSPITAL_COMMUNITY): Payer: Self-pay

## 2022-06-05 DIAGNOSIS — G35 Multiple sclerosis: Secondary | ICD-10-CM

## 2022-06-05 NOTE — Telephone Encounter (Signed)
Pt last seen 11/28/21 and next f/u 07/16/22. Last refilled rx 03/27/22 #90.

## 2022-06-06 ENCOUNTER — Other Ambulatory Visit (HOSPITAL_COMMUNITY): Payer: Self-pay

## 2022-06-06 MED ORDER — ZOLPIDEM TARTRATE 10 MG PO TABS
10.0000 mg | ORAL_TABLET | Freq: Every day | ORAL | 1 refills | Status: DC
  Filled 2022-06-06 – 2022-06-09 (×2): qty 90, 90d supply, fill #0
  Filled 2022-06-09: qty 10, 10d supply, fill #0
  Filled 2022-06-27: qty 80, 80d supply, fill #1

## 2022-06-09 ENCOUNTER — Other Ambulatory Visit (HOSPITAL_COMMUNITY): Payer: Self-pay

## 2022-06-10 ENCOUNTER — Other Ambulatory Visit (HOSPITAL_COMMUNITY): Payer: Self-pay

## 2022-06-26 ENCOUNTER — Telehealth: Payer: Self-pay | Admitting: *Deleted

## 2022-06-26 NOTE — Telephone Encounter (Signed)
Physician is calling because he is wanting to speak with physician, not Urgent,something he wants to ask over the phone.

## 2022-06-27 ENCOUNTER — Other Ambulatory Visit (HOSPITAL_COMMUNITY): Payer: Self-pay

## 2022-06-28 ENCOUNTER — Other Ambulatory Visit (HOSPITAL_COMMUNITY): Payer: Self-pay

## 2022-07-02 ENCOUNTER — Other Ambulatory Visit: Payer: Self-pay | Admitting: Neurology

## 2022-07-02 NOTE — Telephone Encounter (Signed)
Requested Prescriptions   Pending Prescriptions Disp Refills   modafinil (PROVIGIL) 200 MG tablet [Pharmacy Med Name: MODAFINIL 200 MG TABLET] 90 tablet     Sig: TAKE 1 TABLET BY MOUTH DAILY   Last seen 11/28/21, next appt scheduled 07/16/22  Dispenses:

## 2022-07-04 ENCOUNTER — Other Ambulatory Visit (HOSPITAL_COMMUNITY): Payer: Self-pay

## 2022-07-14 ENCOUNTER — Other Ambulatory Visit (HOSPITAL_COMMUNITY): Payer: Self-pay

## 2022-07-14 MED ORDER — FINASTERIDE 5 MG PO TABS
5.0000 mg | ORAL_TABLET | Freq: Every day | ORAL | 3 refills | Status: DC
  Filled 2022-07-14: qty 90, 90d supply, fill #0
  Filled 2022-10-14 (×2): qty 90, 90d supply, fill #1
  Filled 2023-01-14: qty 90, 90d supply, fill #2
  Filled 2023-04-15: qty 90, 90d supply, fill #3

## 2022-07-16 ENCOUNTER — Ambulatory Visit (INDEPENDENT_AMBULATORY_CARE_PROVIDER_SITE_OTHER): Payer: Medicare Other | Admitting: Neurology

## 2022-07-16 ENCOUNTER — Encounter: Payer: Self-pay | Admitting: Neurology

## 2022-07-16 ENCOUNTER — Other Ambulatory Visit (HOSPITAL_COMMUNITY): Payer: Self-pay

## 2022-07-16 ENCOUNTER — Ambulatory Visit: Payer: Medicare Other | Admitting: Neurology

## 2022-07-16 VITALS — BP 143/94 | HR 82 | Ht 69.0 in | Wt 172.5 lb

## 2022-07-16 DIAGNOSIS — R29898 Other symptoms and signs involving the musculoskeletal system: Secondary | ICD-10-CM

## 2022-07-16 DIAGNOSIS — M21372 Foot drop, left foot: Secondary | ICD-10-CM

## 2022-07-16 DIAGNOSIS — R5383 Other fatigue: Secondary | ICD-10-CM

## 2022-07-16 DIAGNOSIS — G4700 Insomnia, unspecified: Secondary | ICD-10-CM

## 2022-07-16 DIAGNOSIS — G35 Multiple sclerosis: Secondary | ICD-10-CM

## 2022-07-16 MED ORDER — ZOLPIDEM TARTRATE 10 MG PO TABS
10.0000 mg | ORAL_TABLET | Freq: Every day | ORAL | 1 refills | Status: DC
  Filled 2022-07-16 – 2022-09-09 (×2): qty 90, 90d supply, fill #0
  Filled 2022-12-08: qty 90, 90d supply, fill #1

## 2022-07-16 MED ORDER — MODAFINIL 200 MG PO TABS
200.0000 mg | ORAL_TABLET | Freq: Every day | ORAL | 1 refills | Status: DC
  Filled 2022-07-16: qty 90, 90d supply, fill #0

## 2022-07-16 NOTE — Progress Notes (Signed)
[facility]  PATIENT: Antonio Cook DOB: 08-Apr-1955  REFERRING DOCTOR OR PCP:  Dr. Felipa Eth SOURCE: Patient, notes from Dr. Anne Hahn, imaging and laboratory reports, MRI images personally reviewed.  _________________________________   HISTORICAL  CHIEF COMPLAINT:  Chief Complaint  Patient presents with   Room 11    Pt is here Alone. Pt states nothing has changed since his last appointment. Pt states no new symptoms to report.     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    UPDATE 07/16/2022 He reports that his MS is stable..   Although there is no acute change, he notes the gait has slightly worsened over the past couple years..   His legs have weakness bilaterally but he does not need a cane or walker.   We had tried amantadine and Ampyra but they were ineffective.   He notes endurance with walking is worse  He walks without support and has some stumbles but no falls.  He reports left > right leg weakness.  He is trying to exercise some.    The left leg gets weaker as the day goes on or if he walks more.     He has some urinary urgency and has had some urge incontinence. He saw urology for bladder dysfunction.  He had cystoscopy.  His issues are felt due to a large prostate.      Vision is doing well   Mood and cognition are doing well.     He has had insomnia but sleeps well with Ambien and tolerates it well.    Fatigue does well on Provigil. He takes 1/2 in the morning and 1/2 a few later later.    He is working full time and stays active as a Therapist, sports.   He mps some by the end of the day but does well in the mornings.  Ampyra had not helped.   He did a [employer] cruise last year and felt he overexerted himself the forst day with a walking tour.      He has LBP that has been mostly stable the past 2 years.      MS history: In 2002, he woke up with reduced right hearing.   He saw his PCP  and was referred to ann ENT but was foud to have normal hearing.   An MRI was performed showing chages c/w MS.  He did not have other symptoms since then.    He saw Dr. Sandria Manly and was found to have a normal exam.   Rebif was started.   He saw Dr. Leotis Shames who felt he had a more aggressive MS.    He opted to continue with Dr. Sandria Manly and on Rebif.   He switched to Dr, Anne Hahn 7-8  years ago and had an episode of left leg weakness 4 years ago.     He had another opinion from Dr. Gaynelle Adu a couple years ago.   He did try Copaxone for a short try.     He went off of medication and has had a stable MRI.    He never had an exacerbation.   Until recently he was very stable.    He notes doing well as long as he gets a good night sleep helped by Ambien.       MRI Review MRI of the head 09/15/2020 shows mild generalized cortical atrophy.  T2/FLAIR hyperintense foci in the left  lateral upper pons and middle cerebellar peduncles..  There are T2/FLAIR hyperintense foci in the periventricular, juxtacortical and deep white matter of both hemispheres.  None of the foci appear to be acute.  They do not enhance.  Compared to the MRI from 10/24/2014, there were no definite changes.  MRI of the cervical spine 10/24/2014 shows T2 hyperintense foci adjacent to C1-C2 and T3.  There was a normal enhancement pattern.  Mild disc bulges in the upper cervical spine but no nerve root compression.  REVIEW OF SYSTEMS: Constitutional: No fevers, chills, sweats, or change in appetite Eyes: No visual changes, double vision, eye pain Ear, nose and throat: No hearing loss, ear pain, nasal congestion, sore throat Cardiovascular: No chest pain, palpitations Respiratory:  No shortness of breath at rest or with exertion.   No wheezes GastrointestinaI: No nausea, vomiting, diarrhea, abdominal pain, fecal incontinence Genitourinary:  No dysuria, urinary retention or frequency.  No nocturia. Musculoskeletal:  No neck pain, back pain Integumentary: No  rash, pruritus, skin lesions Neurological: as above Psychiatric: No depression at this time.  No anxiety Endocrine: No palpitations, diaphoresis, change in appetite, change in weigh or increased thirst Hematologic/Lymphatic:  No anemia, purpura, petechiae. Allergic/Immunologic: No itchy/runny eyes, nasal congestion, recent allergic reactions, rashes  ALLERGIES: Allergies  Allergen Reactions   Gadolinium Derivatives Nausea And Vomiting    Pt vomited after injection.    Hctz [Hydrochlorothiazide] Rash    HOME MEDICATIONS:  Current Outpatient Medications:    ALPRAZolam (XANAX) 0.5 MG tablet, Take 1 tablet (0.5 mg total) by mouth daily as needed for anxiety., Disp: 25 tablet, Rfl: 0   finasteride (PROSCAR) 5 MG tablet, Take 1 tablet (5 mg total) by mouth daily., Disp: 90 tablet, Rfl: 3   losartan (COZAAR) 100 MG tablet, TAKE 1 TABLET BY MOUTH AT BEDTIME, Disp: 90 tablet, Rfl: 3   Magnesium 250 MG TABS, Take 250 mg by mouth., Disp: , Rfl:    tamsulosin (FLOMAX) 0.4 MG CAPS capsule, TAKE 1 CAPSULE BY MOUTH 2 TIMES DAILY, Disp: 180 capsule, Rfl: 2   valACYclovir (VALTREX) 1000 MG tablet, Take 1 tablet by mouth 3 times a day for 7 days, Disp: 21 tablet, Rfl: 0   amantadine (SYMMETREL) 100 MG capsule, Take 1 capsule (100 mg total) by mouth 2 (two) times daily. (Patient not taking: Reported on 07/16/2022), Disp: 60 capsule, Rfl: 11   Cholecalciferol 25 MCG (1000 UT) tablet, Take by mouth. (Patient not taking: Reported on 07/16/2022), Disp: , Rfl:    losartan (COZAAR) 100 MG tablet, Take 1 tablet (100 mg total) by mouth daily. (Patient not taking: Reported on 07/16/2022), Disp: 90 tablet, Rfl: 3   modafinil (PROVIGIL) 200 MG tablet, TAKE 1 TABLET BY MOUTH DAILY (Patient not taking: Reported on 07/16/2022), Disp: 90 tablet, Rfl: 1   modafinil (PROVIGIL) 200 MG tablet, Take 1 tablet (200 mg) by mouth daily., Disp: 90 tablet, Rfl: 1   ondansetron (ZOFRAN) 4 MG tablet, Take 1 tablet (4 mg total) by mouth every  8 (eight) hours as needed for nausea or vomiting. (Patient not taking: Reported on 07/16/2022), Disp: 5 tablet, Rfl: 0   tamsulosin (FLOMAX) 0.4 MG CAPS capsule, Take 1 capsule (0.4 mg total) by mouth 2 (two) times daily. (Patient not taking: Reported on 07/16/2022), Disp: 180 capsule, Rfl: 3   Tdap (BOOSTRIX) 5-2.5-18.5 LF-MCG/0.5 injection, Inject into the muscle. (Patient not taking: Reported on 07/16/2022), Disp: 0.5 mL, Rfl: 0   zolpidem (AMBIEN) 10 MG tablet, Take 1 tablet (10  mg) by mouth at bedtime., Disp: 90 tablet, Rfl: 1  PAST MEDICAL HISTORY: Past Medical History:  Diagnosis Date   Hypertension    MS (multiple sclerosis) (HCC)     PAST SURGICAL HISTORY: Past Surgical History:  Procedure Laterality Date   ACHILLES TENDON REPAIR Left     FAMILY HISTORY: Family History  Problem Relation Age of Onset   Lymphoma Mother    Cancer Father    Cancer Maternal Grandfather    Multiple sclerosis Neg Hx     SOCIAL HISTORY:  Social History   Socioeconomic History   Marital status: Married    Spouse name: Not on file   Number of children: 2   Years of education: Not on file   Highest education level: Not on file  Occupational History   Occupation: Physician  Tobacco Use   Smoking status: Never   Smokeless tobacco: Never  Substance and Sexual Activity   Alcohol use: No   Drug use: No   Sexual activity: Not on file  Other Topics Concern   Not on file  Social History Narrative   Patient drinks 1 cup of caffeine daily.   Patient is right handed.   Social Determinants of Health   Financial Resource Strain: Not on file  Food Insecurity: Not on file  Transportation Needs: Not on file  Physical Activity: Not on file  Stress: Not on file  Social Connections: Not on file  Intimate Partner Violence: Not on file     PHYSICAL EXAM  Vitals:   07/16/22 1559  BP: (!) 143/94  Pulse: 82  Weight: 172 lb 8 oz (78.2 kg)  Height: 5\' 9"  (1.753 m)    Body mass index is 25.47  kg/m.   General: The patient is well-developed and well-nourished and in no acute distress  HEENT:  Head is [place]/AT.  Sclera are anicteric.    Skin: Extremities are without rash or  edema.  Neurologic Exam  Mental status: The patient is alert and oriented x 3 at the time of the examination. The patient has apparent normal recent and remote memory, with an apparently normal attention span and concentration ability.   Speech is normal.  Cranial nerves: Extraocular movements are full.  Color vision was normal.  Patient strength and sensation was normal.  No dysarthria.  No obvious hearing deficits are noted.  Motor:  Muscle bulk is normal.   Tone is normal. Strength is  5 / 5 in all 4 extremities.   Sensory: Sensory testing is intact to pinprick, soft touch and vibration sensation in all 4 extremities.  Coordination: Cerebellar testing reveals good finger-nose-finger and heel-to-shin bilaterally.  Gait and station: Station is normal.  He has a mild left foot drop.  The gait is minimally wide.. Tandem gait is wide. Romberg is negative.   Reflexes: Deep tendon reflexes are symmetric and normal in the arms but increased in the legs with crossed abductors at the knees.  However, there was no ankle clonus.Marland Kitchen       DIAGNOSTIC DATA (LABS, IMAGING, TESTING) - I reviewed patient records, labs, notes, testing and imaging myself where available.  Lab Results  Component Value Date   WBC 8.3 10/11/2014   HGB 13.9 10/11/2014   HCT 42.1 10/11/2014   MCV 86 10/11/2014   PLT 194 10/11/2014      Component Value Date/Time   NA 141 10/11/2014 1158   K 4.5 10/11/2014 1158   CL 102 10/11/2014 1158   CO2 22 10/11/2014  1158   GLUCOSE 106 (H) 10/11/2014 1158   BUN 21 10/11/2014 1158   CREATININE 1.10 10/26/2016 1210   CALCIUM 9.3 10/11/2014 1158   PROT 6.8 10/11/2014 1158   ALBUMIN 4.3 10/11/2014 1158   AST 14 10/11/2014 1158   ALT 15 10/11/2014 1158   ALKPHOS 67 10/11/2014 1158   BILITOT <0.2  10/11/2014 1158   GFRNONAA 76 10/11/2014 1158   GFRAA 87 10/11/2014 1158       ASSESSMENT AND PLAN  Weakness of left leg  Multiple sclerosis (HCC) - Plan: zolpidem (AMBIEN) 10 MG tablet  Left foot drop  Insomnia, unspecified type  Other fatigue   He will remain off of a disease modifying therapy.  We had a conversation about the transition from relapsing MS to secondary progressive MS.  We discussed some medications currently in clinical trials.   continue modafinil for excessive daytime sleepiness and Ambien for insomnia at night Stay active and exercise as tolerated. Return in 1 year but call sooner if there are new or worsening neurologic symptoms.  Nyzier Boivin A. Epimenio Foot, MD, [facility] 07/16/2022, 6:12 PM Certified in Neurology, Clinical Neurophysiology, Sleep Medicine and Neuroimaging  [facility] [address], [address] [place], [place] [address] [phone]

## 2022-07-17 ENCOUNTER — Other Ambulatory Visit (HOSPITAL_COMMUNITY): Payer: Self-pay

## 2022-07-19 ENCOUNTER — Other Ambulatory Visit (HOSPITAL_COMMUNITY): Payer: Self-pay

## 2022-07-28 ENCOUNTER — Telehealth: Payer: Self-pay | Admitting: Neurology

## 2022-07-28 ENCOUNTER — Other Ambulatory Visit (HOSPITAL_COMMUNITY): Payer: Self-pay

## 2022-07-28 MED ORDER — AMPHETAMINE-DEXTROAMPHETAMINE 10 MG PO TABS
5.0000 mg | ORAL_TABLET | Freq: Two times a day (BID) | ORAL | 0 refills | Status: DC
  Filled 2022-07-28: qty 60, 30d supply, fill #0

## 2022-07-28 NOTE — Telephone Encounter (Signed)
He is having more cognitive and physical fatigue.  He would like to try the Adderall that we discussed at the last visit.  I will send in a prescription.  I recommend that he try it without the Provigil initially but that the combination could be considered if either of the medications by themselves are not helpful enough.

## 2022-07-28 NOTE — Telephone Encounter (Signed)
Pt asking for a call from Dr Epimenio Foot (at the end of the day) to discuss a medication(he did not want to mention the name of the medication).

## 2022-07-29 ENCOUNTER — Telehealth: Payer: Self-pay

## 2022-07-29 ENCOUNTER — Other Ambulatory Visit (HOSPITAL_COMMUNITY): Payer: Self-pay

## 2022-07-29 NOTE — Telephone Encounter (Signed)
Pharmacy Patient Advocate Encounter   Received notification from [employer] that prior authorization for Modafinil 200MG  tablets is required/requested.   PA submitted to [facility] via CoverMyMeds Key or (Medicaid) confirmation # [id number]  Status is pending

## 2022-07-30 ENCOUNTER — Other Ambulatory Visit (HOSPITAL_COMMUNITY): Payer: Self-pay

## 2022-08-06 ENCOUNTER — Other Ambulatory Visit (HOSPITAL_COMMUNITY): Payer: Self-pay

## 2022-08-11 NOTE — Telephone Encounter (Signed)
Pharmacy Patient Advocate Encounter  Prior Authorization for Modafinil 200MG  tablets has been APPROVED by [employer] from 07/29/2022 to 01/28/2023.   PA # PA Case ID #: [id number]

## 2022-08-25 ENCOUNTER — Other Ambulatory Visit (HOSPITAL_COMMUNITY): Payer: Self-pay

## 2022-09-09 ENCOUNTER — Other Ambulatory Visit (HOSPITAL_COMMUNITY): Payer: Self-pay

## 2022-09-15 ENCOUNTER — Other Ambulatory Visit (HOSPITAL_COMMUNITY): Payer: Self-pay

## 2022-09-23 ENCOUNTER — Other Ambulatory Visit (HOSPITAL_COMMUNITY): Payer: Self-pay

## 2022-09-23 MED ORDER — MIRABEGRON ER 50 MG PO TB24
50.0000 mg | ORAL_TABLET | Freq: Every day | ORAL | 3 refills | Status: DC
  Filled 2022-09-23 – 2022-09-24 (×3): qty 30, 30d supply, fill #0
  Filled 2022-11-03 – 2022-11-12 (×2): qty 30, 30d supply, fill #1
  Filled 2022-12-15: qty 30, 30d supply, fill #2
  Filled 2023-01-19: qty 30, 30d supply, fill #3

## 2022-09-24 ENCOUNTER — Other Ambulatory Visit (HOSPITAL_COMMUNITY): Payer: Self-pay

## 2022-10-14 ENCOUNTER — Other Ambulatory Visit (HOSPITAL_COMMUNITY): Payer: Self-pay

## 2022-11-03 ENCOUNTER — Other Ambulatory Visit (HOSPITAL_COMMUNITY): Payer: Self-pay

## 2022-11-12 ENCOUNTER — Other Ambulatory Visit (HOSPITAL_COMMUNITY): Payer: Self-pay

## 2022-11-20 ENCOUNTER — Other Ambulatory Visit: Payer: Self-pay

## 2022-11-20 ENCOUNTER — Ambulatory Visit (INDEPENDENT_AMBULATORY_CARE_PROVIDER_SITE_OTHER): Payer: Medicare Other | Admitting: Sports Medicine

## 2022-11-20 VITALS — BP 112/74 | Ht 69.5 in | Wt 170.0 lb

## 2022-11-20 DIAGNOSIS — M25511 Pain in right shoulder: Secondary | ICD-10-CM | POA: Diagnosis present

## 2022-11-20 DIAGNOSIS — G8929 Other chronic pain: Secondary | ICD-10-CM

## 2022-11-21 NOTE — Progress Notes (Signed)
   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 Notes onset was ~2 months ago. He hurt it swatting at a bug. He hasn't done any specific therapy exercises for this but notes about 90% improvement over the past 8 weeks. He does exercise daily, but primarily focuses on LE strength in the setting of his chronic MS. He is curious about what to do with his right shoulder as he is trying to start playing pickleball with his wife.  ROS:     See HPI  PERTINENT  PMH / PSH FH / / SH:  Past Medical, Surgical, Social, and Family History Reviewed & Updated in the EMR.  Pertinent findings include:  multiple sclerosis  Allergies  Allergen Reactions   Gadolinium Derivatives Nausea And Vomiting    Pt vomited after injection.    Hctz [Hydrochlorothiazide] Rash   OBJECTIVE:  BP 112/74   Ht 5' 9.5" (1.765 m)   Wt 170 lb (77.1 kg)   BMI 24.74 kg/m   PHYSICAL EXAM:  GEN: Alert and Oriented, NAD, comfortable in exam room RESP: Unlabored respirations, symmetric chest rise PSY: normal mood, congruent affect   RIGHT SHOULDER MSK EXAM: No swelling, ecchymoses.  No gross deformity. NT to palpation. FROM, mild discomfort with ER/IR. Negative Hawkins, Neers. Negative Yergasons, Speeds and Obriens. Strength 4+/5 with empty can (mild discomfort) and resisted internal/external rotation (mild discomfort). Negative crossarm testing NV intact distally.   Limited MSK [place] of Shoulder, Right  Patient was seated on exam table and shoulder US examination was performed using high frequency linear probe. The long head of biceps tendon was visualized within the bicipital groove in both longitudinal and transverse axis with no defect in tendinous fibers. There was some hypoechoic fluid in mid tendon and some mild peritendinous hypoechoic changes.  The subscapularis tendon was visualized in both longitudinal and transverse axis  with intact tendon inserting at the inferior lesser tubercle of the humerus. No signs of tear, no hypoechoic changes or tissue irregularity were seen.  The supraspinatus tendon was visualized in longitudinal, transverse, and dynamic views. Small partial thickness articular surface tear noted.  The infraspinatus and teres minor tendons were visualized in both longitudinal and transverse axis with tendon insertion at the middle facet of the great tubercle of the humerus with no signs of tearing, hypoechoic changes though infraspinatus is thin.  The [facility] Joint was visualized without both bursal distension or significant bony spurs/arthritic changes.    IMPRESSION: Partial thickness tears of supraspinatus and thin infraspinatus muscle belly. Mild swelling around LH biceps tendon as well.     U/S performed by Celine Mans, MD and interpreted with Glean Salen, MD and Enid Baas, MD    ASSESSMENT & PLAN:  1. Chronic right shoulder pain Presentation c/w with now remote supraspinatus and LH biceps tendon partial thickness tearing. He does also have a weaker infraspinatus, though no appreciable tearing on [place]. Reassuring exam, though we reviewed a tailored home exercise program to rehab his rotator cuff and bicep. Advised gradual increase in weight and overhead activity once pain is improved, including pickleball. F/u in 6 weeks or as needed for monitoring of progress.   Glean Salen, MD PGY-4, Sports Medicine Fellow [facility]  I observed and examined the patient with the [facility] resident and agree with assessment and plan.  Note reviewed and modified by me. Sterling Big, MD

## 2022-12-01 ENCOUNTER — Other Ambulatory Visit (HOSPITAL_COMMUNITY): Payer: Self-pay

## 2022-12-08 ENCOUNTER — Other Ambulatory Visit: Payer: Self-pay

## 2022-12-08 ENCOUNTER — Other Ambulatory Visit (HOSPITAL_COMMUNITY): Payer: Self-pay

## 2022-12-15 ENCOUNTER — Other Ambulatory Visit (HOSPITAL_COMMUNITY): Payer: Self-pay

## 2022-12-17 ENCOUNTER — Other Ambulatory Visit (HOSPITAL_COMMUNITY): Payer: Self-pay

## 2022-12-26 ENCOUNTER — Other Ambulatory Visit: Payer: Self-pay | Admitting: Neurology

## 2022-12-29 NOTE — Telephone Encounter (Signed)
Requested Prescriptions   Pending Prescriptions Disp Refills   modafinil (PROVIGIL) 200 MG tablet [Pharmacy Med Name: MODAFINIL 200 MG TABLET] 90 tablet     Sig: TAKE 1 TABLET BY MOUTH DAILY   Last seen 07/16/22 Next appt 07/16/23 Dispenses  No dispenses within 180 days of the adherence period (since 12/30/2021)

## 2022-12-30 ENCOUNTER — Other Ambulatory Visit (HOSPITAL_COMMUNITY): Payer: Self-pay

## 2022-12-30 ENCOUNTER — Other Ambulatory Visit: Payer: Self-pay

## 2023-01-14 ENCOUNTER — Other Ambulatory Visit (HOSPITAL_COMMUNITY): Payer: Self-pay

## 2023-01-15 ENCOUNTER — Other Ambulatory Visit (HOSPITAL_COMMUNITY): Payer: Self-pay

## 2023-01-19 ENCOUNTER — Other Ambulatory Visit (HOSPITAL_COMMUNITY): Payer: Self-pay

## 2023-01-27 ENCOUNTER — Other Ambulatory Visit (HOSPITAL_COMMUNITY): Payer: Self-pay

## 2023-01-27 MED ORDER — HALOBETASOL PROPIONATE 0.05 % EX OINT
TOPICAL_OINTMENT | CUTANEOUS | 0 refills | Status: DC
  Filled 2023-01-27: qty 60, 14d supply, fill #0

## 2023-01-28 ENCOUNTER — Other Ambulatory Visit (HOSPITAL_COMMUNITY): Payer: Self-pay

## 2023-02-16 ENCOUNTER — Other Ambulatory Visit (HOSPITAL_COMMUNITY): Payer: Self-pay

## 2023-02-17 ENCOUNTER — Other Ambulatory Visit (HOSPITAL_BASED_OUTPATIENT_CLINIC_OR_DEPARTMENT_OTHER): Payer: Self-pay

## 2023-02-17 ENCOUNTER — Other Ambulatory Visit (HOSPITAL_COMMUNITY): Payer: Self-pay

## 2023-02-17 MED ORDER — MIRABEGRON ER 50 MG PO TB24
50.0000 mg | ORAL_TABLET | Freq: Every day | ORAL | 3 refills | Status: AC
  Filled 2023-02-17 (×2): qty 30, 30d supply, fill #0
  Filled 2023-03-27: qty 30, 30d supply, fill #1
  Filled 2023-05-01: qty 30, 30d supply, fill #2

## 2023-03-04 ENCOUNTER — Other Ambulatory Visit: Payer: Self-pay | Admitting: Neurology

## 2023-03-04 ENCOUNTER — Other Ambulatory Visit (HOSPITAL_COMMUNITY): Payer: Self-pay

## 2023-03-04 DIAGNOSIS — G35 Multiple sclerosis: Secondary | ICD-10-CM

## 2023-03-05 ENCOUNTER — Other Ambulatory Visit: Payer: Self-pay

## 2023-03-05 ENCOUNTER — Other Ambulatory Visit (HOSPITAL_COMMUNITY): Payer: Self-pay

## 2023-03-05 MED ORDER — ZOLPIDEM TARTRATE 10 MG PO TABS
10.0000 mg | ORAL_TABLET | Freq: Every day | ORAL | 1 refills | Status: DC
  Filled 2023-03-05 – 2023-03-06 (×4): qty 90, 90d supply, fill #0
  Filled 2023-06-02: qty 90, 90d supply, fill #1
  Filled ????-??-??: fill #0

## 2023-03-05 NOTE — Telephone Encounter (Signed)
Last seen 07/16/22 and next f/u 07/16/23. Last refilled 12/08/22 #90.

## 2023-03-06 ENCOUNTER — Other Ambulatory Visit (HOSPITAL_COMMUNITY): Payer: Self-pay

## 2023-03-12 ENCOUNTER — Other Ambulatory Visit: Payer: Self-pay | Admitting: Internal Medicine

## 2023-03-12 ENCOUNTER — Other Ambulatory Visit (HOSPITAL_COMMUNITY): Payer: Self-pay

## 2023-03-12 DIAGNOSIS — E785 Hyperlipidemia, unspecified: Secondary | ICD-10-CM

## 2023-03-27 ENCOUNTER — Other Ambulatory Visit (HOSPITAL_COMMUNITY): Payer: Self-pay

## 2023-03-30 ENCOUNTER — Other Ambulatory Visit (HOSPITAL_COMMUNITY): Payer: Self-pay

## 2023-03-31 ENCOUNTER — Other Ambulatory Visit (HOSPITAL_COMMUNITY): Payer: Self-pay

## 2023-04-01 ENCOUNTER — Other Ambulatory Visit (HOSPITAL_COMMUNITY): Payer: Self-pay

## 2023-04-01 MED ORDER — LOSARTAN POTASSIUM 100 MG PO TABS
100.0000 mg | ORAL_TABLET | Freq: Every day | ORAL | 3 refills | Status: DC
  Filled 2023-04-01: qty 90, 90d supply, fill #0
  Filled 2023-06-30: qty 90, 90d supply, fill #1

## 2023-04-10 ENCOUNTER — Other Ambulatory Visit: Payer: Medicare Other

## 2023-04-15 ENCOUNTER — Other Ambulatory Visit (HOSPITAL_COMMUNITY): Payer: Self-pay

## 2023-04-24 ENCOUNTER — Ambulatory Visit
Admission: RE | Admit: 2023-04-24 | Discharge: 2023-04-24 | Disposition: A | Discharge status: Home / Self Care | Payer: Self-pay | Source: Ambulatory Visit | Attending: Internal Medicine | Admitting: Internal Medicine

## 2023-04-24 DIAGNOSIS — E785 Hyperlipidemia, unspecified: Secondary | ICD-10-CM

## 2023-05-01 ENCOUNTER — Other Ambulatory Visit (HOSPITAL_COMMUNITY): Payer: Self-pay

## 2023-05-01 MED ORDER — GEMTESA 75 MG PO TABS
75.0000 mg | ORAL_TABLET | Freq: Every day | ORAL | 3 refills | Status: AC
  Filled 2023-05-01: qty 30, 30d supply, fill #0
  Filled 2023-07-20: qty 30, 30d supply, fill #1
  Filled 2023-11-30: qty 30, 30d supply, fill #2

## 2023-05-05 ENCOUNTER — Other Ambulatory Visit (HOSPITAL_COMMUNITY): Payer: Self-pay

## 2023-05-05 MED ORDER — TAMSULOSIN HCL 0.4 MG PO CAPS
0.4000 mg | ORAL_CAPSULE | Freq: Two times a day (BID) | ORAL | 3 refills | Status: AC
  Filled 2023-05-05: qty 180, 90d supply, fill #0
  Filled 2023-08-04: qty 180, 90d supply, fill #1
  Filled 2023-11-02: qty 180, 90d supply, fill #2
  Filled 2024-02-08: qty 180, 90d supply, fill #3

## 2023-05-06 ENCOUNTER — Other Ambulatory Visit: Payer: Self-pay

## 2023-05-06 ENCOUNTER — Other Ambulatory Visit (HOSPITAL_COMMUNITY): Payer: Self-pay

## 2023-05-06 MED ORDER — ROSUVASTATIN CALCIUM 10 MG PO TABS
10.0000 mg | ORAL_TABLET | Freq: Every day | ORAL | 3 refills | Status: AC
  Filled 2023-05-06: qty 90, 90d supply, fill #0
  Filled 2024-03-17: qty 90, 90d supply, fill #1

## 2023-05-06 MED ORDER — ETODOLAC 200 MG PO CAPS
200.0000 mg | ORAL_CAPSULE | Freq: Three times a day (TID) | ORAL | 1 refills | Status: AC | PRN
  Filled 2023-05-06: qty 30, 10d supply, fill #0

## 2023-05-06 MED ORDER — LOSARTAN POTASSIUM 100 MG PO TABS
100.0000 mg | ORAL_TABLET | Freq: Every day | ORAL | 3 refills | Status: AC
  Filled 2023-05-06 – 2023-09-28 (×2): qty 90, 90d supply, fill #0

## 2023-05-07 ENCOUNTER — Other Ambulatory Visit: Payer: Self-pay

## 2023-06-01 ENCOUNTER — Other Ambulatory Visit (HOSPITAL_COMMUNITY): Payer: Self-pay

## 2023-06-02 ENCOUNTER — Other Ambulatory Visit (HOSPITAL_COMMUNITY): Payer: Self-pay

## 2023-06-30 ENCOUNTER — Other Ambulatory Visit (HOSPITAL_COMMUNITY): Payer: Self-pay

## 2023-07-02 ENCOUNTER — Other Ambulatory Visit: Payer: Self-pay | Admitting: Neurology

## 2023-07-02 NOTE — Telephone Encounter (Signed)
 Dr.Yan you are back up MD  Last seen on 07/16/22 Follow up scheduled on 07/16/23   Last filled on 04/05/23 #90 tablets   Rx pending to be signed

## 2023-07-09 ENCOUNTER — Other Ambulatory Visit: Payer: Self-pay

## 2023-07-09 ENCOUNTER — Other Ambulatory Visit (HOSPITAL_COMMUNITY): Payer: Self-pay

## 2023-07-09 MED ORDER — FINASTERIDE 5 MG PO TABS
5.0000 mg | ORAL_TABLET | Freq: Every day | ORAL | 3 refills | Status: AC
  Filled 2023-07-09: qty 90, 90d supply, fill #0
  Filled 2023-10-07: qty 90, 90d supply, fill #1
  Filled 2024-01-18: qty 90, 90d supply, fill #2

## 2023-07-16 ENCOUNTER — Encounter: Payer: Self-pay | Admitting: Neurology

## 2023-07-16 ENCOUNTER — Ambulatory Visit (INDEPENDENT_AMBULATORY_CARE_PROVIDER_SITE_OTHER): Payer: Medicare Other | Admitting: Neurology

## 2023-07-16 ENCOUNTER — Other Ambulatory Visit (HOSPITAL_COMMUNITY): Payer: Self-pay

## 2023-07-16 VITALS — BP 129/77 | HR 87 | Ht 69.0 in | Wt 169.0 lb

## 2023-07-16 DIAGNOSIS — G35 Multiple sclerosis: Secondary | ICD-10-CM

## 2023-07-16 DIAGNOSIS — G8929 Other chronic pain: Secondary | ICD-10-CM

## 2023-07-16 DIAGNOSIS — R29898 Other symptoms and signs involving the musculoskeletal system: Secondary | ICD-10-CM

## 2023-07-16 DIAGNOSIS — M545 Low back pain, unspecified: Secondary | ICD-10-CM

## 2023-07-16 MED ORDER — ALPRAZOLAM 0.5 MG PO TABS
0.5000 mg | ORAL_TABLET | Freq: Every day | ORAL | 1 refills | Status: AC | PRN
  Filled 2023-07-16: qty 25, 25d supply, fill #0
  Filled 2023-10-06: qty 25, 25d supply, fill #1

## 2023-07-16 NOTE — Progress Notes (Unsigned)
 [facility]  PATIENT: Antonio Cook DOB: June 02, 1955  REFERRING DOCTOR OR PCP:  Dr. Thor Fling SOURCE: Patient, notes from Dr. Tilda Fogo, imaging and laboratory reports, MRI images personally reviewed.  _________________________________   HISTORICAL  CHIEF COMPLAINT:  Chief Complaint  Patient presents with   RM/Weakness of left leg    Pt is here Alone. Pt states that his legs are slowly getting weaker. Pt denies any recent falls. Pt denies any muscle spasms, tingling, or numbness in his legs. Pt states he went to the [employer] [facility] and would like to have input on wether or not he should do it. Pt wants to know if Dr. Godwin Lat has a PT that deals with MS only. Pt needs refill on Xanax .      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    UPDATE 07/16/2022 He reports that his MS is stable..   Although there is no acute change, he notes the gait has slightly worsened over the past couple years..   His legs have weakness bilaterally but he does not need a cane or walker.   We had tried amantadine  and Ampyra  but they were ineffective.   He notes endurance with walking is worse  He walks without support and has some stumbles but no falls.  He reports left > right leg weakness.  He is trying to exercise some.    The left leg gets weaker as the day goes on or if he walks more.     He has some urinary urgency and has had some urge incontinence. He saw urology for bladder dysfunction.  He had cystoscopy.  His issues are felt due to a large prostate.      Vision is doing well   Mood and cognition are doing well.     He has had insomnia but sleeps well with Ambien  and tolerates it well.    Fatigue does well on Provigil . He takes 1/2 in the morning and 1/2 a few later later.    He is working full time and stays active as a Therapist, sports.   He mps some by the end of the day but does well in the mornings.  Ampyra  had not helped.    He  has LBP when he wakes up and notes gait worsens later I'm the day.   He does exercise most days .      MS history: In 2002, he woke up with reduced right hearing.   He saw his PCP and was referred to [facility] but was foud to have normal hearing.   An MRI was performed showing chages c/w MS.  He did not have other symptoms since then.    He saw Dr. Dania Dupre and was found to have a normal exam.   Rebif  was started.   He saw Dr. Sulema Endo who felt he had a more aggressive MS.    He opted to continue with Dr. Dania Dupre and on Rebif .   He switched to Dr, Tilda Fogo 7-8  years ago and had an episode of left leg weakness in 2018 (exacerbation).    He did try Copaxone for a short try.     He went off of medication and has had a stable MRI.    Symptoms became more progressive since about 2020.  He notes doing well as long as he gets a good night sleep helped by Ambien .  MRI Review MRI of the head 09/15/2020 shows mild generalized cortical atrophy.  T2/FLAIR hyperintense foci in the left lateral upper pons and middle cerebellar peduncles..  There are T2/FLAIR hyperintense foci in the periventricular, juxtacortical and deep white matter of both hemispheres.  None of the foci appear to be acute.  They do not enhance.  Compared to the MRI from 10/24/2014, there were no definite changes.  MRI of the cervical spine 10/24/2014 shows T2 hyperintense foci adjacent to C1-C2 and T3.  There was a normal enhancement pattern.  Mild disc bulges in the upper cervical spine but no nerve root compression.  MRI lumbar 2015 showed multilevel mild DJD with facet hypertrophy and bulges abut no nerve root compression or spinal stenosis.    REVIEW OF SYSTEMS: Constitutional: No fevers, chills, sweats, or change in appetite Eyes: No visual changes, double vision, eye pain Ear, nose and throat: No hearing loss, ear pain, nasal congestion, sore throat Cardiovascular: No chest pain, palpitations Respiratory:  No shortness of breath at rest or with  exertion.   No wheezes GastrointestinaI: No nausea, vomiting, diarrhea, abdominal pain, fecal incontinence Genitourinary:  No dysuria, urinary retention or frequency.  No nocturia. Musculoskeletal:  No neck pain, back pain Integumentary: No rash, pruritus, skin lesions Neurological: as above Psychiatric: No depression at this time.  No anxiety Endocrine: No palpitations, diaphoresis, change in appetite, change in weigh or increased thirst Hematologic/Lymphatic:  No anemia, purpura, petechiae. Allergic/Immunologic: No itchy/runny eyes, nasal congestion, recent allergic reactions, rashes  ALLERGIES: Allergies  Allergen Reactions   Gadolinium Derivatives Nausea And Vomiting    Pt vomited after injection.    Hctz [Hydrochlorothiazide] Rash    HOME MEDICATIONS:  Current Outpatient Medications:    amantadine  (SYMMETREL ) 100 MG capsule, Take 1 capsule (100 mg total) by mouth 2 (two) times daily., Disp: 60 capsule, Rfl: 11   Cholecalciferol 25 MCG (1000 UT) tablet, Take by mouth., Disp: , Rfl:    etodolac  (LODINE ) 200 MG capsule, Take 1 capsule (200 mg total) by mouth every 8 (eight) hours with food as needed., Disp: 30 capsule, Rfl: 1   finasteride  (PROSCAR ) 5 MG tablet, Take 1 tablet (5 mg total) by mouth daily., Disp: 90 tablet, Rfl: 3   halobetasol  (ULTRAVATE ) 0.05 % ointment, Apply topically 2 times a day for 14 days. (NOT FOR FACE OR FOLDS), Disp: 60 g, Rfl: 0   losartan  (COZAAR ) 100 MG tablet, Take 1 tablet (100 mg total) by mouth daily., Disp: 90 tablet, Rfl: 3   Magnesium 250 MG TABS, Take 250 mg by mouth., Disp: , Rfl:    mirabegron  ER (MYRBETRIQ ) 50 MG TB24 tablet, Take 1 tablet (50 mg total) by mouth daily., Disp: 30 tablet, Rfl: 3   modafinil  (PROVIGIL ) 200 MG tablet, TAKE 1 TABLET BY MOUTH DAILY, Disp: 90 tablet, Rfl: 1   rosuvastatin  (CRESTOR ) 10 MG tablet, Take 1 tablet (10 mg total) by mouth daily., Disp: 90 tablet, Rfl: 3   tamsulosin  (FLOMAX ) 0.4 MG CAPS capsule, Take 1  capsule (0.4 mg total) by mouth 2 (two) times daily., Disp: 180 capsule, Rfl: 3   valACYclovir  (VALTREX ) 1000 MG tablet, Take 1 tablet by mouth 3 times a day for 7 days, Disp: 21 tablet, Rfl: 0   Vibegron  (GEMTESA ) 75 MG TABS, Take 1 tablet (75 mg total) by mouth daily., Disp: 30 tablet, Rfl: 3   zolpidem  (AMBIEN ) 10 MG tablet, Take 1 tablet (10 mg) by mouth at bedtime., Disp: 90 tablet, Rfl: 1  ALPRAZolam  (XANAX ) 0.5 MG tablet, Take 1 tablet (0.5 mg total) by mouth daily as needed for anxiety., Disp: 25 tablet, Rfl: 1   amphetamine -dextroamphetamine  (ADDERALL) 10 MG tablet, Take 1/2-1 tablet (5-10 mg total) by mouth 2 (two) times daily. (Patient not taking: Reported on 07/16/2023), Disp: 60 tablet, Rfl: 0   losartan  (COZAAR ) 100 MG tablet, TAKE 1 TABLET BY MOUTH AT BEDTIME (Patient not taking: Reported on 07/16/2023), Disp: 90 tablet, Rfl: 3   losartan  (COZAAR ) 100 MG tablet, Take 1 tablet (100 mg total) by mouth daily. (Patient not taking: Reported on 07/16/2023), Disp: 90 tablet, Rfl: 3   modafinil  (PROVIGIL ) 200 MG tablet, Take 1 tablet (200 mg) by mouth daily. (Patient not taking: Reported on 07/16/2023), Disp: 90 tablet, Rfl: 1   ondansetron  (ZOFRAN ) 4 MG tablet, Take 1 tablet (4 mg total) by mouth every 8 (eight) hours as needed for nausea or vomiting. (Patient not taking: Reported on 07/16/2023), Disp: 5 tablet, Rfl: 0   tamsulosin  (FLOMAX ) 0.4 MG CAPS capsule, TAKE 1 CAPSULE BY MOUTH 2 TIMES DAILY (Patient not taking: Reported on 07/16/2023), Disp: 180 capsule, Rfl: 2   tamsulosin  (FLOMAX ) 0.4 MG CAPS capsule, Take 1 capsule (0.4 mg total) by mouth 2 (two) times daily. (Patient not taking: Reported on 07/16/2023), Disp: 180 capsule, Rfl: 3   Tdap (BOOSTRIX ) 5-2.5-18.5 LF-MCG/0.5 injection, Inject into the muscle. (Patient not taking: Reported on 07/16/2023), Disp: 0.5 mL, Rfl: 0  PAST MEDICAL HISTORY: Past Medical History:  Diagnosis Date   Hypertension    MS (multiple sclerosis) (HCC)     PAST  SURGICAL HISTORY: Past Surgical History:  Procedure Laterality Date   ACHILLES TENDON REPAIR Left     FAMILY HISTORY: Family History  Problem Relation Age of Onset   Lymphoma Mother    Cancer Father    Cancer Maternal Grandfather    Multiple sclerosis Neg Hx     SOCIAL HISTORY:  Social History   Socioeconomic History   Marital status: Married    Spouse name: Not on file   Number of children: 2   Years of education: Not on file   Highest education level: Not on file  Occupational History   Occupation: Physician  Tobacco Use   Smoking status: Never   Smokeless tobacco: Never  Substance and Sexual Activity   Alcohol use: No   Drug use: No   Sexual activity: Not on file  Other Topics Concern   Not on file  Social History Narrative   Patient drinks 1 cup of caffeine daily.   Patient is right handed.   Social Drivers of Corporate investment banker Strain: Not on file  Food Insecurity: Low Risk  (05/06/2022)   [facility]   Hunger Vital Sign    Worried About Running Out of Food in the Last Year: Never true    Ran Out of Food in the Last Year: Never true  Transportation Needs: Not on file (05/06/2022)  Physical Activity: Not on file  Stress: Not on file  Social Connections: Not on file  Intimate Partner Violence: Not on file     PHYSICAL EXAM  Vitals:   07/16/23 1529  BP: 129/77  Pulse: 87  Weight: 169 lb (76.7 kg)  Height: 5\' 9"  (1.753 m)    Body mass index is 24.96 kg/m.   General: The patient is well-developed and well-nourished and in no acute distress  HEENT:  Head is [place]/AT.  Sclera are anicteric.  Skin: Extremities are without rash or  edema.  Neurologic Exam  Mental status: The patient is alert and oriented x 3 at the time of the examination. The patient has apparent normal recent and remote memory, with an apparently normal attention span and concentration ability.   Speech is normal.  Cranial nerves:  Extraocular movements are full.  Color vision was normal.  Patient strength and sensation was normal.  No dysarthria.  No obvious hearing deficits are noted.  Motor:  Muscle bulk is normal.   Tone is normal. Strength is 4+/5 in the iliopsoas muscles and 5 / 5 elsewhere in the 4 extremities.   Sensory: Sensory testing is intact to pinprick, soft touch and vibration sensation in all 4 extremities.  Coordination: Cerebellar testing reveals good finger-nose-finger and heel-to-shin bilaterally.  Gait and station: Station is normal.  He has a mild left foot drop.  The gait is minimally wide.. Tandem gait is wide.  The Romberg is negative.  Reflexes: Deep tendon reflexes are symmetric and normal in the arms but increased in the legs with crossed abductors at the knees.  Ankle reflexes were normal.    DIAGNOSTIC DATA (LABS, IMAGING, TESTING) - I reviewed patient records, labs, notes, testing and imaging myself where available.  Lab Results  Component Value Date   WBC 8.3 10/11/2014   HGB 13.9 10/11/2014   HCT 42.1 10/11/2014   MCV 86 10/11/2014   PLT 194 10/11/2014      Component Value Date/Time   NA 141 10/11/2014 1158   K 4.5 10/11/2014 1158   CL 102 10/11/2014 1158   CO2 22 10/11/2014 1158   GLUCOSE 106 (H) 10/11/2014 1158   BUN 21 10/11/2014 1158   CREATININE 1.10 10/26/2016 1210   CALCIUM  9.3 10/11/2014 1158   PROT 6.8 10/11/2014 1158   ALBUMIN 4.3 10/11/2014 1158   AST 14 10/11/2014 1158   ALT 15 10/11/2014 1158   ALKPHOS 67 10/11/2014 1158   BILITOT <0.2 10/11/2014 1158   GFRNONAA 76 10/11/2014 1158   GFRAA 87 10/11/2014 1158       ASSESSMENT AND PLAN  Multiple sclerosis (HCC) - Plan: Ambulatory referral to Physical Therapy  Weakness of left leg - Plan: Ambulatory referral to Physical Therapy  Chronic midline low back pain without sciatica - Plan: Ambulatory referral to Physical Therapy   He will remain off of a disease modifying therapy.  We had a long  conversation about the transition from relapsing MS to secondary progressive MS.  We also had a long discussion about medications for secondary progressive MS currently at the FDA for review any clinical trials, including tolerability of the that potentially could be available by the end of the year.  He could be a candidate for this medication.  There is expected to be a REMS program.  When he comes back in 6 months for his next appointment we will have an opportunity to discuss this medication in more detail if it gets approved.  We also discussed other medications and clinical trials  continue modafinil  for excessive daytime sleepiness and Ambien  for insomnia at night Stay active and exercise as tolerated.  PT/rehab to help with leg strength and gait Return in 6 months but call sooner if there are new or worsening neurologic symptoms.  42-minute office visit with the majority of the time spent face-to-face for history and physical, discussion/counseling and decision-making.  Additional time with record review and documentation.   This visit is part of a comprehensive longitudinal  care medical relationship regarding the patients primary diagnosis of multiple sclerosis and related concerns.   Chai Verdejo A. Godwin Lat, MD, [facility] 07/17/2023, 11:13 AM Certified in Neurology, Clinical Neurophysiology, Sleep Medicine and Neuroimaging  [facility] [address] [place], [place] [address] [phone]

## 2023-07-20 ENCOUNTER — Other Ambulatory Visit (HOSPITAL_COMMUNITY): Payer: Self-pay

## 2023-07-31 ENCOUNTER — Ambulatory Visit: Admitting: Podiatry

## 2023-07-31 DIAGNOSIS — L600 Ingrowing nail: Secondary | ICD-10-CM | POA: Diagnosis not present

## 2023-07-31 NOTE — Patient Instructions (Signed)

## 2023-08-03 NOTE — Progress Notes (Signed)
 Subjective:   Patient ID: Antonio Cook, male   DOB: 68 y.o.   MRN: [id number]   HPI Patient presents stating that he has developed a lot of pain in his left big toenail and it has been sore without any active drainage or redness   ROS      Objective:  Physical Exam  Neurovascular status intact with incurvated lateral border left big toe slight thickness of the nailbed history of having lost that nail with new nail regrowing slight irritation of the second nail     Assessment:  Ingrown toenail deformity left hallux lateral border painful and ingrown left second nail     Plan:  H&P reviewed and I have recommended removal of the nail border and I explained procedure risk patient wants surgery I infiltrated 60 mg Xylocaine Marcaine mixture sterile prep done using sterile instrumentation I remove the lateral border exposed matrix applied phenol 3 applications 30 seconds followed by alcohol lavage sterile dressing gave instructions on soaks wear dressing 24 hours take it off earlier if throbbing were to occur and trimmed the second nail

## 2023-08-04 ENCOUNTER — Other Ambulatory Visit (HOSPITAL_COMMUNITY): Payer: Self-pay

## 2023-08-10 ENCOUNTER — Ambulatory Visit: Admitting: Podiatry

## 2023-08-10 ENCOUNTER — Other Ambulatory Visit (HOSPITAL_BASED_OUTPATIENT_CLINIC_OR_DEPARTMENT_OTHER): Payer: Self-pay

## 2023-08-10 ENCOUNTER — Other Ambulatory Visit (HOSPITAL_COMMUNITY): Payer: Self-pay

## 2023-08-10 DIAGNOSIS — L03032 Cellulitis of left toe: Secondary | ICD-10-CM

## 2023-08-10 MED ORDER — DOXYCYCLINE HYCLATE 100 MG PO CAPS
100.0000 mg | ORAL_CAPSULE | Freq: Two times a day (BID) | ORAL | 0 refills | Status: AC
  Filled 2023-08-10: qty 20, 10d supply, fill #0

## 2023-08-10 NOTE — Progress Notes (Unsigned)
      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  He underwent an ingrown toenail procedure by Dr. Magdalen on 07/31/2023.  He states that it was healing well for the first couple of days, but then it started to drain more and become more tender.  Past Medical History:  Diagnosis Date   Hypertension    MS (multiple sclerosis) (HCC)    Past Surgical History:  Procedure Laterality Date   ACHILLES TENDON REPAIR Left    Allergies  Allergen Reactions   Gadolinium Derivatives Nausea And Vomiting    Pt vomited after injection.    Hctz [Hydrochlorothiazide] Rash    Physical Exam: Palpable pedal pulses noted on the left foot.  There is mild localized erythema along the lateral periungual margin.  Evidence of recent serosanguineous drainage is noted.  No purulence or malodor is noted.  No significant maceration is seen but there is mild maceration.  No granulomatous formation is noted.  No evidence of recurrence of ingrown toenail are seen.  Pain on palpation of area  Assessment/Plan of Care: 1. Cellulitis of toe of left foot      Meds ordered this encounter  Medications   doxycycline (VIBRAMYCIN) 100 MG capsule    Sig: Take 1 capsule (100 mg total) by mouth 2 (two) times daily for 10 days.    Dispense:  20 capsule    Refill:  0   Discussed findings with the patient today. He was given Iodosorb ointment to apply a very thin amount to the area once daily followed by a Band-Aid if he is in close toed shoes or leave open to the air when at home, and prescribed oral doxycycline.  Encouraged to stop covering the toe once it starts to dry out.  Follow-up as needed   Antonio Cook, DPM, FACFAS [facility]     [address]  [place], [place] [address]                Office [phone]  Fax [phone]

## 2023-08-12 ENCOUNTER — Telehealth: Payer: Self-pay | Admitting: Neurology

## 2023-08-12 NOTE — Telephone Encounter (Signed)
 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

## 2023-08-18 ENCOUNTER — Ambulatory Visit: Admitting: Physical Therapy

## 2023-08-24 ENCOUNTER — Other Ambulatory Visit: Payer: Self-pay | Admitting: Neurology

## 2023-08-24 ENCOUNTER — Other Ambulatory Visit (HOSPITAL_COMMUNITY): Payer: Self-pay

## 2023-08-24 DIAGNOSIS — G35 Multiple sclerosis: Secondary | ICD-10-CM

## 2023-08-24 MED ORDER — ZOLPIDEM TARTRATE 10 MG PO TABS
10.0000 mg | ORAL_TABLET | Freq: Every day | ORAL | 1 refills | Status: DC
  Filled 2023-08-24 – 2023-08-31 (×2): qty 90, 90d supply, fill #0
  Filled 2023-11-27 (×2): qty 90, 90d supply, fill #1

## 2023-08-24 NOTE — Telephone Encounter (Signed)
 Last seen on 07/16/23 Follow up scheduled on 02/24/24   Dispensed Days Supply Quantity Provider Pharmacy  zolpidem  (AMBIEN ) 10 MG tablet 06/02/2023 90 90 tablet Sater, Charlie LABOR, MD [facility]...     Rx pending to be signed

## 2023-08-25 ENCOUNTER — Encounter: Payer: Self-pay | Admitting: Physical Therapy

## 2023-08-25 ENCOUNTER — Ambulatory Visit: Attending: Neurology | Admitting: Physical Therapy

## 2023-08-25 ENCOUNTER — Other Ambulatory Visit: Payer: Self-pay

## 2023-08-25 DIAGNOSIS — R29898 Other symptoms and signs involving the musculoskeletal system: Secondary | ICD-10-CM | POA: Insufficient documentation

## 2023-08-25 DIAGNOSIS — R2689 Other abnormalities of gait and mobility: Secondary | ICD-10-CM | POA: Insufficient documentation

## 2023-08-25 DIAGNOSIS — M5459 Other low back pain: Secondary | ICD-10-CM | POA: Diagnosis present

## 2023-08-25 DIAGNOSIS — G8929 Other chronic pain: Secondary | ICD-10-CM | POA: Insufficient documentation

## 2023-08-25 DIAGNOSIS — M6281 Muscle weakness (generalized): Secondary | ICD-10-CM | POA: Insufficient documentation

## 2023-08-25 DIAGNOSIS — M545 Low back pain, unspecified: Secondary | ICD-10-CM | POA: Diagnosis not present

## 2023-08-25 DIAGNOSIS — G35 Multiple sclerosis: Secondary | ICD-10-CM | POA: Diagnosis not present

## 2023-08-25 NOTE — Therapy (Signed)
 OUTPATIENT PHYSICAL THERAPY NEURO EVALUATION   Patient Name: Antonio Cook MRN: [id number] DOB:1955-05-21, 68 y.o., male Today's Date: 08/25/2023   PCP: Janey Santos, MD REFERRING PROVIDER: Vear Charlie LABOR, MD   END OF SESSION:  PT End of Session - 08/25/23 1740     Visit Number 1    Number of Visits 4    Date for PT Re-Evaluation 10/23/23    Authorization Type Medicare/BCBS    PT Start Time 1617    PT Stop Time 1707    PT Time Calculation (min) 50 min    Activity Tolerance Patient tolerated treatment well    Behavior During Therapy WFL for tasks assessed/performed          Past Medical History:  Diagnosis Date   Hypertension    MS (multiple sclerosis) (HCC)    Past Surgical History:  Procedure Laterality Date   ACHILLES TENDON REPAIR Left    Patient Active Problem List   Diagnosis Date Noted   Impacted cerumen of left ear 05/06/2022   Patellar subluxation, right, initial encounter 04/13/2020   Weakness of left leg 10/05/2014   Right hip pain 01/17/2014   Multiple sclerosis (HCC) 02/11/2000    ONSET DATE: 07/16/2023 (MD referral)  REFERRING DIAG:  G35 (ICD-10-CM) - Multiple sclerosis (HCC)  R29.898 (ICD-10-CM) - Weakness of left leg  M54.50,G89.29 (ICD-10-CM) - Chronic midline low back pain without sciatica    THERAPY DIAG:  Muscle weakness (generalized)  Other low back pain  Other abnormalities of gait and mobility  Rationale for Evaluation and Treatment: Rehabilitation  SUBJECTIVE:                                                                                                                                                                                             SUBJECTIVE STATEMENT: I'll tell you all you need to know about my MS.  See a PT (O'Halloran) on a semi-regular basis for strength, balance, core stability and do exercises at home.  About 7 years ago, LLE became about 20% weaker and dx is progressive MS.  Present symptoms are  that legs are weaker over the past several years.  Wake up in the morning and I'm good, and as the day goes on, the legs are weaker and my low back hurts. Pt describes pain along lateral low back muscles and buttocks area. Pt accompanied by: self  PERTINENT HISTORY: MS, LLE>RLE weakness, low back pain  PAIN:  Are you having pain? Yes: NPRS scale: 4-5/10 Pain location: low back and hips Pain description: stiffness Aggravating factors: worse during the day Relieving factors: [place], good sleep, better  in the morning after some movement  PRECAUTIONS: Fall  RED FLAGS: None   WEIGHT BEARING RESTRICTIONS: No  FALLS: Has patient fallen in last 6 months? No  LIVING ENVIRONMENT: Lives with: lives with their family Lives in: House/apartment Stairs: stairs to bedroom Has following equipment at home: None  PLOF: Independent and Vocation/Vocational requirements: Works full time as a Therapist, sports  PATIENT GOALS: To help with the pain in hips/low back  OBJECTIVE:  Note: Objective measures were completed at Evaluation unless otherwise noted.  DIAGNOSTIC FINDINGS: From MRI 09/2020:  IMPRESSION: This MRI of the brain with and without contrast shows the following: 1.  T2/FLAIR hyperintense foci in the hemispheres and brainstem in a pattern consistent with chronic demyelinating plaque associated with multiple sclerosis.  None of the foci appear to be acute.  They do not enhance.  Compared to the MRI dated 09/11/2018, there are no new lesions. 2.   No acute findings.  Normal enhancement pattern.  COGNITION: Overall cognitive status: Within functional limits for tasks assessed     LOWER EXTREMITY MMT:    MMT Right Eval Left Eval  Hip flexion 5 4  Hip extension 3+ 3  Hip abduction  3 (compensates with hip flex)  Hip adduction    Hip internal rotation    Hip external rotation    Knee flexion 5 4  Knee extension 5 3+  Ankle dorsiflexion 5 4  Ankle plantarflexion    Ankle inversion     Ankle eversion    (Blank rows = not tested)   TRANSFERS: Sit to stand: Modified independence  Assistive device utilized: prefers armrest of chair     Stand to sit: Modified independence  Assistive device utilized: armrest of chair      GAIT: Findings: Gait Characteristics: step through pattern, decreased ankle dorsiflexion- Left, knee flexed in stance- Right, knee flexed in stance- Left, trendelenburg, decreased trunk rotation, and poor foot clearance- Left, Distance walked: 50 ft, Assistive device utilized:None, Level of assistance: Modified independence, and Comments: reports walking gets more difficult later in the day  FUNCTIONAL TESTS:  5 times sit to stand: 14.84 sec 10 meter walk test: 10.41 sec =  3.15 ft/sec                                                                                                                              TREATMENT DATE: 08/25/2023    PATIENT EDUCATION: Education details: Eval results, POC, initial HEP Person educated: Patient Education method: Explanation, Demonstration, and emailed handouts Education comprehension: verbalized understanding, returned demonstration, and verbal cues required  HOME EXERCISE PROGRAM: Access Code: [email or web address] URL: [email or web address] Date: 08/25/2023 Prepared by: [facility] - Outpatient  Rehab - [facility]  Exercises - Seated Piriformis Stretch  - 1 x daily - 7 x weekly - 1 sets - 3 reps - 30 sec hold - Seated Figure 4 Piriformis Stretch  - 1 x daily - 7 x weekly - 3 sets -  10 reps - Quadruped Alternating Leg Extensions  - 1 x daily - 7 x weekly - 1 sets - 3 reps-cues for abdominal activation  GOALS: Goals reviewed with patient? Yes  SHORT TERM GOALS: Target date: 09/25/2023  Pt will be independent with HEP for improved strength, gait, decreased pain. Baseline: Goal status: INITIAL  LONG TERM GOALS: Target date: 10/23/2023  Pt will be independent with HEP for improved strength, gait,  decreased pain. Baseline:  Goal status: INITIAL  2.  Pt will improve 5x sit<>stand to less than or equal to 11.6 sec to demonstrate improved functional strength and transfer efficiency. Baseline: 14.84 sec Goal status: INITIAL  3.  Pt will report 50% reduction in pain in low back and buttocks by the end of work day. Baseline:  Goal status: INITIAL  4.  Pt will improve L hip abductor/extensors to 3+/5 for improved strength, gait pattern and decreased pain. Baseline:  Goal status: INITIAL   ASSESSMENT:  CLINICAL IMPRESSION: Patient is a 68 y.o. male who was seen today for physical therapy evaluation and treatment for MS. He presents with reports of progressive weakness in BLEs, LLE weaker than RLE, over the past several years.  He notes lateral lumbar musculature pain and pain in buttocks, more at the end of the day; he has some pain early in the day, but is able to work improve this with his extensive stretch/exercise program in the morning.  Pt presents today with decreased hip/glut weakness, lateral trunk sway, Trendelenburg gait pattern, which may be contributing to low back and buttock fatigue.  Provided initial exercises to help with lumbar and piriformis flexibility as well as core stability.  He may benefit from additional exercises for strengthening of gluts as well as ways to help improve gait pattern/ease of mobility, to prevent trunk compensations.  OBJECTIVE IMPAIRMENTS: Abnormal gait, decreased mobility, difficulty walking, decreased strength, and pain.   ACTIVITY LIMITATIONS: locomotion level  PARTICIPATION LIMITATIONS: community activity and occupation  PERSONAL FACTORS: 1-2 comorbidities: see above are also affecting patient's functional outcome.   REHAB POTENTIAL: Good  CLINICAL DECISION MAKING: Stable/uncomplicated  EVALUATION COMPLEXITY: Low  PLAN:  PT FREQUENCY: 2x/month (per pt request)  PT DURATION: 8 weeks  PLANNED INTERVENTIONS: 97110-Therapeutic  exercises, 97530- Therapeutic activity, 97112- Neuromuscular re-education, 97535- Self Care, 02859- Manual therapy, [phone]- Gait training, and Patient/Family education  PLAN FOR NEXT SESSION: Review initial HEP and progress to include glut strengthening; lower trunk rotation stretch, open book stretch, ways to improve fluid gait pattern   Khianna Blazina W., PT 08/25/2023, 5:44 PM  [facility] at [facility] [address], [address] [place], [place] [address] Phone # [phone] Fax # [phone]

## 2023-08-31 ENCOUNTER — Other Ambulatory Visit (HOSPITAL_COMMUNITY): Payer: Self-pay

## 2023-09-23 ENCOUNTER — Encounter: Payer: Self-pay | Admitting: Podiatry

## 2023-09-23 ENCOUNTER — Other Ambulatory Visit (HOSPITAL_COMMUNITY): Payer: Self-pay

## 2023-09-23 ENCOUNTER — Ambulatory Visit (INDEPENDENT_AMBULATORY_CARE_PROVIDER_SITE_OTHER): Admitting: Podiatry

## 2023-09-23 DIAGNOSIS — L03032 Cellulitis of left toe: Secondary | ICD-10-CM

## 2023-09-23 MED ORDER — NEOMYCIN-POLYMYXIN-HC 3.5-10000-1 OT SUSP
OTIC | 0 refills | Status: DC
  Filled 2023-09-23 (×2): qty 10, 50d supply, fill #0
  Filled 2023-09-24: qty 10, 30d supply, fill #0

## 2023-09-23 NOTE — Patient Instructions (Addendum)
 Place 1/4 cup of epsom salts in a quart of warm tap water.  Submerge your foot or feet in the solution and soak for 20 minutes.  This soak should be done twice a day.  Next, remove your foot or feet from solution, blot dry the affected area. Apply antibiotic drops and cover if instructed by your doctor.   IF YOUR SKIN BECOMES IRRITATED WHILE USING THESE INSTRUCTIONS, IT IS OKAY TO SWITCH TO  WHITE VINEGAR AND WATER.  As another alternative soak, you may use antibacterial soap and water.  Monitor for any signs/symptoms of infection. Call the office immediately if any occur or go directly to the emergency room. Call with any questions/concerns.

## 2023-09-23 NOTE — Progress Notes (Signed)
 Chief Complaint  Patient presents with   Nail Problem    L great toe Started draining and scabbing on lower lateral nail.  Non Diabetic.  No anti coag Not having pain unless touching that specific area.  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  He presents today with concern for the left first toe lateral border.  Had PNA procedure on this back in late June after the nail became irritated.  Does report that the nail plate had fallen off months ago.  Following the procedure, he did have concern for delayed healing at one point and was started on doxycycline .  He went on to heal uneventfully.  He reports that about a week ago he noticed irritation at the left hallux lateral border which began scabbing over and had tenderness at the base of the nail.  Past Medical History:  Diagnosis Date   Hypertension    MS (multiple sclerosis) (HCC)     Past Surgical History:  Procedure Laterality Date   ACHILLES TENDON REPAIR Left     Allergies  Allergen Reactions   Gadolinium Derivatives Nausea And Vomiting    Pt vomited after injection.    Hctz [Hydrochlorothiazide] Rash    ROS    Physical Exam: There were no vitals filed for this visit.  General: The patient is alert and oriented x3 in no acute distress.  Dermatology: Left hallux lateral border proximal aspect is scabbed over, upon inspection somewhat loosely adhered with some underlying fibrous drainage.  Some irregularity of the nail plate is noted at this area without clear evidence of regrowth at the lateral border.  Vascular: Palpable pedal pulses bilaterally. Capillary refill within normal limits.  Mild localized erythema associated with the left hallux lateral nail border proximal aspect, no edema.  Neurological: Light touch sensation grossly intact bilateral feet.   Musculoskeletal Exam: No pedal deformities noted.  Muscle strength 5/5 for all major muscle  groups.  Radiographic Exam:  Normal osseous mineralization. Joint spaces preserved.  No fractures or osseous irregularities noted.  Assessment/Plan of Care: 1. Paronychia of toe, left      Meds ordered this encounter  Medications   neomycin -polymyxin-hydrocortisone (CORTISPORIN ) 3.5-10000-1 OTIC suspension    Sig: Apply 1-2 drops daily after soaking and cover with bandaid    Dispense:  10 mL    Refill:  0   None  Discussed clinical findings with patient today.  Did recommend radiographs today to evaluate for any underlying osseous cause that could be contributing to recurrence such as bone spur or rule out any evidence of deep infection.  Will have patient soak foot in warm Epsom salts or antibacterial soap and warm water twice a day and apply 1-2 Corticosporin drops to the affected aspects of the nail plate.  Apply with bandage.  Can wean off of soaking and bandaging if stable scab starts to form.  Discussed signs and symptoms of worsening infection and instructed patient to contact office immediately if this occurs and can call in antibiotics.  Reevaluate in 2 weeks.  Radiographs at that point if ongoing.   Quatavious Rossa L. Lamount MAUL, AACFAS [facility]     [address]  [place], [place] [address]                Office [phone]  Fax [phone]

## 2023-09-24 ENCOUNTER — Other Ambulatory Visit: Payer: Self-pay

## 2023-09-24 ENCOUNTER — Other Ambulatory Visit (HOSPITAL_COMMUNITY): Payer: Self-pay

## 2023-09-28 ENCOUNTER — Other Ambulatory Visit (HOSPITAL_COMMUNITY): Payer: Self-pay

## 2023-10-06 ENCOUNTER — Other Ambulatory Visit (HOSPITAL_COMMUNITY): Payer: Self-pay

## 2023-10-07 ENCOUNTER — Other Ambulatory Visit (HOSPITAL_COMMUNITY): Payer: Self-pay

## 2023-10-09 ENCOUNTER — Other Ambulatory Visit (HOSPITAL_COMMUNITY): Payer: Self-pay

## 2023-11-02 ENCOUNTER — Other Ambulatory Visit (HOSPITAL_COMMUNITY): Payer: Self-pay

## 2023-11-05 ENCOUNTER — Other Ambulatory Visit (HOSPITAL_COMMUNITY): Payer: Self-pay

## 2023-11-05 MED ORDER — FLUTICASONE PROPIONATE 50 MCG/ACT NA SUSP
2.0000 | Freq: Every day | NASAL | 0 refills | Status: AC
  Filled 2023-11-05: qty 16, 30d supply, fill #0

## 2023-11-05 MED ORDER — HYDROCODONE BIT-HOMATROP MBR 5-1.5 MG/5ML PO SOLN
5.0000 mL | Freq: Four times a day (QID) | ORAL | 0 refills | Status: AC | PRN
  Filled 2023-11-05: qty 100, 5d supply, fill #0

## 2023-11-05 MED ORDER — LEVOFLOXACIN 500 MG PO TABS
500.0000 mg | ORAL_TABLET | Freq: Every day | ORAL | 0 refills | Status: DC
  Filled 2023-11-05: qty 7, 7d supply, fill #0

## 2023-11-08 ENCOUNTER — Emergency Department (HOSPITAL_BASED_OUTPATIENT_CLINIC_OR_DEPARTMENT_OTHER)

## 2023-11-08 ENCOUNTER — Emergency Department (HOSPITAL_BASED_OUTPATIENT_CLINIC_OR_DEPARTMENT_OTHER): Admitting: Radiology

## 2023-11-08 ENCOUNTER — Other Ambulatory Visit: Payer: Self-pay

## 2023-11-08 ENCOUNTER — Inpatient Hospital Stay (HOSPITAL_BASED_OUTPATIENT_CLINIC_OR_DEPARTMENT_OTHER)
Admission: EM | Admit: 2023-11-08 | Discharge: 2023-11-11 | DRG: 194 | Disposition: A | Discharge status: Home / Self Care | Attending: Internal Medicine | Admitting: Internal Medicine

## 2023-11-08 ENCOUNTER — Encounter (HOSPITAL_BASED_OUTPATIENT_CLINIC_OR_DEPARTMENT_OTHER): Payer: Self-pay

## 2023-11-08 DIAGNOSIS — J189 Pneumonia, unspecified organism: Secondary | ICD-10-CM | POA: Diagnosis not present

## 2023-11-08 DIAGNOSIS — G35D Multiple sclerosis, unspecified: Secondary | ICD-10-CM | POA: Diagnosis present

## 2023-11-08 DIAGNOSIS — C3490 Malignant neoplasm of unspecified part of unspecified bronchus or lung: Secondary | ICD-10-CM | POA: Diagnosis present

## 2023-11-08 DIAGNOSIS — R590 Localized enlarged lymph nodes: Secondary | ICD-10-CM | POA: Diagnosis present

## 2023-11-08 DIAGNOSIS — E785 Hyperlipidemia, unspecified: Secondary | ICD-10-CM | POA: Diagnosis present

## 2023-11-08 DIAGNOSIS — N400 Enlarged prostate without lower urinary tract symptoms: Secondary | ICD-10-CM | POA: Diagnosis present

## 2023-11-08 DIAGNOSIS — Z1152 Encounter for screening for COVID-19: Secondary | ICD-10-CM

## 2023-11-08 DIAGNOSIS — Z888 Allergy status to other drugs, medicaments and biological substances status: Secondary | ICD-10-CM | POA: Diagnosis not present

## 2023-11-08 DIAGNOSIS — G4700 Insomnia, unspecified: Secondary | ICD-10-CM | POA: Diagnosis present

## 2023-11-08 DIAGNOSIS — J188 Other pneumonia, unspecified organism: Principal | ICD-10-CM | POA: Diagnosis present

## 2023-11-08 DIAGNOSIS — Z79899 Other long term (current) drug therapy: Secondary | ICD-10-CM | POA: Diagnosis not present

## 2023-11-08 DIAGNOSIS — I10 Essential (primary) hypertension: Secondary | ICD-10-CM | POA: Diagnosis present

## 2023-11-08 LAB — COMPREHENSIVE METABOLIC PANEL WITH GFR
ALT: 23 U/L (ref 0–44)
AST: 20 U/L (ref 15–41)
Albumin: 3.8 g/dL (ref 3.5–5.0)
Alkaline Phosphatase: 84 U/L (ref 38–126)
Anion gap: 13 (ref 5–15)
BUN: 15 mg/dL (ref 8–23)
CO2: 26 mmol/L (ref 22–32)
Calcium: 10 mg/dL (ref 8.9–10.3)
Chloride: 100 mmol/L (ref 98–111)
Creatinine, Ser: 1.26 mg/dL — ABNORMAL HIGH (ref 0.61–1.24)
GFR, Estimated: >60 mL/min (ref >60)
Glucose, Bld: 124 mg/dL — ABNORMAL HIGH (ref 70–99)
Potassium: 3.9 mmol/L (ref 3.5–5.1)
Sodium: 139 mmol/L (ref 135–145)
Total Bilirubin: 0.5 mg/dL (ref 0.0–1.2)
Total Protein: 7.5 g/dL (ref 6.5–8.1)

## 2023-11-08 LAB — CBC WITH DIFFERENTIAL/PLATELET
Abs Immature Granulocytes: 0.5 K/uL — ABNORMAL HIGH (ref 0.00–0.07)
Basophils Absolute: 0.1 K/uL (ref 0.0–0.1)
Basophils Relative: 1 %
Eosinophils Absolute: 0.1 K/uL (ref 0.0–0.5)
Eosinophils Relative: 1 %
HCT: 40.3 % (ref 39.0–52.0)
Hemoglobin: 13.2 g/dL (ref 13.0–17.0)
Immature Granulocytes: 2 %
Lymphocytes Relative: 6 %
Lymphs Abs: 1.2 K/uL (ref 0.7–4.0)
MCH: 28.4 pg (ref 26.0–34.0)
MCHC: 32.8 g/dL (ref 30.0–36.0)
MCV: 86.7 fL (ref 80.0–100.0)
Monocytes Absolute: 2.2 K/uL — ABNORMAL HIGH (ref 0.1–1.0)
Monocytes Relative: 11 %
Neutro Abs: 16.4 K/uL — ABNORMAL HIGH (ref 1.7–7.7)
Neutrophils Relative %: 79 %
Platelets: 303 K/uL (ref 150–400)
RBC: 4.65 MIL/uL (ref 4.22–5.81)
RDW: 12.9 % (ref 11.5–15.5)
WBC: 20.4 K/uL — ABNORMAL HIGH (ref 4.0–10.5)
nRBC: 0 % (ref 0.0–0.2)

## 2023-11-08 LAB — MRSA NEXT GEN BY PCR, NASAL: MRSA by PCR Next Gen: NOT DETECTED

## 2023-11-08 LAB — RESP PANEL BY RT-PCR (RSV, FLU A&B, COVID)  RVPGX2
Influenza A by PCR: NEGATIVE
Influenza B by PCR: NEGATIVE
Resp Syncytial Virus by PCR: NEGATIVE
SARS Coronavirus 2 by RT PCR: NEGATIVE

## 2023-11-08 LAB — LACTIC ACID, PLASMA: Lactic Acid, Venous: 1.5 mmol/L (ref 0.5–1.9)

## 2023-11-08 MED ORDER — LACTATED RINGERS IV SOLN: INTRAVENOUS | Status: DC

## 2023-11-08 MED ORDER — LACTATED RINGERS IV BOLUS (SEPSIS)
1000.0000 mL | Freq: Once | INTRAVENOUS | Status: AC
  Administered 2023-11-08: 1000 mL via INTRAVENOUS

## 2023-11-08 MED ORDER — LACTATED RINGERS IV BOLUS
1000.0000 mL | Freq: Once | INTRAVENOUS | Status: AC
  Administered 2023-11-08: 1000 mL via INTRAVENOUS

## 2023-11-08 MED ORDER — GUAIFENESIN-DM 100-10 MG/5ML PO SYRP: 5.0000 mL | ORAL_SOLUTION | ORAL | Status: DC | PRN

## 2023-11-08 MED ORDER — ALPRAZOLAM 0.5 MG PO TABS: 0.5000 mg | ORAL_TABLET | Freq: Every day | ORAL | Status: DC | PRN

## 2023-11-08 MED ORDER — LORAZEPAM 0.5 MG PO TABS
0.5000 mg | ORAL_TABLET | Freq: Two times a day (BID) | ORAL | Status: DC | PRN
  Administered 2023-11-10: 0.5 mg via ORAL
  Filled 2023-11-08: qty 1

## 2023-11-08 MED ORDER — POLYETHYLENE GLYCOL 3350 17 G PO PACK: 17.0000 g | PACK | Freq: Every day | ORAL | Status: DC | PRN

## 2023-11-08 MED ORDER — ZOLPIDEM TARTRATE 5 MG PO TABS
5.0000 mg | ORAL_TABLET | Freq: Once | ORAL | Status: DC
  Filled 2023-11-08: qty 1

## 2023-11-08 MED ORDER — HYDROCODONE BIT-HOMATROP MBR 5-1.5 MG/5ML PO SOLN: 5.0000 mL | Freq: Four times a day (QID) | ORAL | Status: DC | PRN

## 2023-11-08 MED ORDER — ENOXAPARIN SODIUM 40 MG/0.4ML IJ SOSY
40.0000 mg | PREFILLED_SYRINGE | Freq: Every day | INTRAMUSCULAR | Status: DC
  Filled 2023-11-08 (×2): qty 0.4

## 2023-11-08 MED ORDER — MAGNESIUM OXIDE -MG SUPPLEMENT 400 (240 MG) MG PO TABS
400.0000 mg | ORAL_TABLET | Freq: Every day | ORAL | Status: DC
  Administered 2023-11-08 – 2023-11-10 (×3): 400 mg via ORAL
  Filled 2023-11-08 (×3): qty 1

## 2023-11-08 MED ORDER — VANCOMYCIN HCL IN DEXTROSE 1-5 GM/200ML-% IV SOLN
1000.0000 mg | Freq: Once | INTRAVENOUS | Status: AC
  Administered 2023-11-08: 1000 mg via INTRAVENOUS
  Filled 2023-11-08: qty 200

## 2023-11-08 MED ORDER — MELATONIN 5 MG PO TABS: 5.0000 mg | ORAL_TABLET | Freq: Every evening | ORAL | Status: DC | PRN

## 2023-11-08 MED ORDER — SACCHAROMYCES BOULARDII 250 MG PO CAPS
250.0000 mg | ORAL_CAPSULE | Freq: Two times a day (BID) | ORAL | Status: DC
  Administered 2023-11-08 – 2023-11-11 (×6): 250 mg via ORAL
  Filled 2023-11-08 (×6): qty 1

## 2023-11-08 MED ORDER — CEFTRIAXONE SODIUM 2 G IJ SOLR: 2.0000 g | Freq: Once | INTRAMUSCULAR | Status: DC

## 2023-11-08 MED ORDER — IPRATROPIUM-ALBUTEROL 0.5-2.5 (3) MG/3ML IN SOLN: 3.0000 mL | RESPIRATORY_TRACT | Status: DC | PRN

## 2023-11-08 MED ORDER — PIPERACILLIN-TAZOBACTAM 3.375 G IVPB 30 MIN
3.3750 g | INTRAVENOUS | Status: AC
  Administered 2023-11-08: 3.375 g via INTRAVENOUS
  Filled 2023-11-08: qty 50

## 2023-11-08 MED ORDER — TAMSULOSIN HCL 0.4 MG PO CAPS
0.4000 mg | ORAL_CAPSULE | Freq: Every day | ORAL | Status: DC
  Administered 2023-11-08 – 2023-11-10 (×3): 0.4 mg via ORAL
  Filled 2023-11-08 (×3): qty 1

## 2023-11-08 MED ORDER — ROSUVASTATIN CALCIUM 10 MG PO TABS
10.0000 mg | ORAL_TABLET | Freq: Every day | ORAL | Status: DC
  Administered 2023-11-09 – 2023-11-11 (×3): 10 mg via ORAL
  Filled 2023-11-08 (×3): qty 1

## 2023-11-08 MED ORDER — SODIUM CHLORIDE 0.9 % IV SOLN: 500.0000 mg | Freq: Once | INTRAVENOUS | Status: DC

## 2023-11-08 MED ORDER — SODIUM CHLORIDE 0.9 % IV SOLN
3.0000 g | Freq: Once | INTRAVENOUS | Status: AC
  Administered 2023-11-08: 3 g via INTRAVENOUS

## 2023-11-08 MED ORDER — ACETAMINOPHEN 325 MG PO TABS
650.0000 mg | ORAL_TABLET | Freq: Four times a day (QID) | ORAL | Status: DC | PRN
  Administered 2023-11-09: 650 mg via ORAL
  Filled 2023-11-08 (×2): qty 2

## 2023-11-08 MED ORDER — PROCHLORPERAZINE EDISYLATE 10 MG/2ML IJ SOLN: 5.0000 mg | Freq: Four times a day (QID) | INTRAMUSCULAR | Status: DC | PRN

## 2023-11-08 MED ORDER — PIPERACILLIN-TAZOBACTAM 3.375 G IVPB
3.3750 g | Freq: Three times a day (TID) | INTRAVENOUS | Status: DC
  Administered 2023-11-09 – 2023-11-11 (×7): 3.375 g via INTRAVENOUS
  Filled 2023-11-08 (×8): qty 50

## 2023-11-08 MED ORDER — ZOLPIDEM TARTRATE 5 MG PO TABS
5.0000 mg | ORAL_TABLET | Freq: Every day | ORAL | Status: DC
Start: 2023-11-08 — End: 2023-11-09
  Filled 2023-11-08: qty 1

## 2023-11-08 MED ORDER — IOHEXOL 300 MG/ML  SOLN
80.0000 mL | Freq: Once | INTRAMUSCULAR | Status: AC | PRN
Start: 2023-11-08 — End: 2023-11-08
  Administered 2023-11-08: 80 mL via INTRAVENOUS

## 2023-11-08 NOTE — Progress Notes (Signed)
 Ambien  is prescribed as 5 mg, patient said that he is taking 10 mg, message the hospitalist on call and another 5 mg was ordered. Both medication was opened, after opening patient decided not to take ambien  because he is tired. Medication is wasted, CN is aware.

## 2023-11-08 NOTE — ED Notes (Signed)
 Patient politely refused blood culture.

## 2023-11-08 NOTE — ED Triage Notes (Signed)
 4 days ago, diag with PNA. Xray left uypper lobe infiltrate. Levaquin  - on day 4, 500 mg PO. Cough has lessened. Still night sweating and fatigue. PMH MS.

## 2023-11-08 NOTE — Progress Notes (Signed)
 Patients SPO2 was 96% on room air, HR was 75 , RR 20. Patient ambulated down the hall, he was able to carry on a conversation without any notable SHOB. Upon his return from the walk, his SPO2 was 98% on room air ,HR was 104, RR 22. He stated I am not SHOB. Patient had no difficulty ambulating.

## 2023-11-08 NOTE — H&P (Addendum)
 History and Physical  Antonio Cook FMW:[id number] DOB: 18-Apr-1955 DOA: 11/08/2023  Referring physician: Accepted by Dr. Zella [facility], hospitalist service. PCP: Avva, Ravisankar, MD  Outpatient Specialists: None. Patient coming from: Home through [facility] ED.  Chief Complaint: Persistent cough, malaise, fatigue, chills, night sweats, fever.  HPI: Antonio Cook is a 68 y.o. male with medical history significant for multiple sclerosis (currently not on therapy), hypertension, hyperlipidemia, who presented to [facility] ED with complaints of persistent cough, malaise, fatigue, chills, night sweats and subjective fevers.  A week and a half ago the patient had 1 day of diarrhea with 3 watery stools.  He developed a cough around that time after he aspirated from his own saliva while sleeping.  4 days ago he was diagnosed with pneumonia seen on chest x-ray by his PCP and was prescribed Levaquin  outpatient.  Symptoms have not improved.  Endorses persistent malaise.  Feels like something is not right with his body.  Continues to have fevers, night sweats, and chills.  Has had decreasing oral intake with 5 pound weight loss during this time.  Denies any exposure to tuberculosis or traveling outside of the country.  In the ER, tachycardic with heart rate of 103, 20.4 K leukocytosis.  Chest CT scan showing adjacent left upper lobe consolidative masses with cavitation more suggestive of lobar pneumonia.  Recommended short-term CT follow-up following appropriate antibiotic therapy to demonstrated improvement/resolution and exclude underlying malignancy.  Mild ipsilateral mediastinal adenopathy is favored reactive.    The patient received Unasyn and IV vancomycin in the ER.  Accepted by, Dr. Zella, [facility], hospitalist service, and transferred to [facility] MedSurg unit as inpatient status.  ED Course: Temperature 98.9.  BP 106/63, pulse 103, respiratory rate 16, O2 saturation 97% on room  air.  Review of Systems: Review of systems as noted in the HPI. All other systems reviewed and are negative.   Past Medical History:  Diagnosis Date   Hypertension    MS (multiple sclerosis)    Past Surgical History:  Procedure Laterality Date   ACHILLES TENDON REPAIR Left     Social History:  reports that he has never smoked. He has never used smokeless tobacco. He reports that he does not drink alcohol and does not use drugs.   Allergies  Allergen Reactions   Gadolinium Derivatives Nausea And Vomiting    Pt vomited after injection.    Hctz [Hydrochlorothiazide] Rash    Family History  Problem Relation Age of Onset   Lymphoma Mother    Cancer Father    Cancer Maternal Grandfather    Multiple sclerosis Neg Hx       Prior to Admission medications   Medication Sig Start Date End Date Taking? Authorizing Provider  ALPRAZolam  (XANAX ) 0.5 MG tablet Take 1 tablet (0.5 mg total) by mouth daily as needed for anxiety. 07/16/23   Sater, Charlie LABOR, MD  Cholecalciferol 25 MCG (1000 UT) tablet Take by mouth.    [provider]  etodolac  (LODINE ) 200 MG capsule Take 1 capsule (200 mg total) by mouth every 8 (eight) hours with food as needed. 05/06/23     finasteride  (PROSCAR ) 5 MG tablet Take 1 tablet (5 mg total) by mouth daily. 07/09/23     fluticasone  (FLONASE ) 50 MCG/ACT nasal spray Place 2 sprays into both nostrils daily. 11/05/23     halobetasol  (ULTRAVATE ) 0.05 % ointment Apply topically 2 times a day for 14 days. (NOT FOR FACE OR FOLDS) 01/27/23  HYDROcodone  bit-homatropine (HYCODAN) 5-1.5 MG/5ML syrup Take 5 mLs by mouth every 6 (six) hours as needed for cough 11/05/23     levofloxacin  (LEVAQUIN ) 500 MG tablet Take 1 tablet (500 mg total) by mouth daily. 11/05/23     losartan  (COZAAR ) 100 MG tablet TAKE 1 TABLET BY MOUTH AT BEDTIME 04/15/21     losartan  (COZAAR ) 100 MG tablet Take 1 tablet (100 mg total) by mouth daily. 04/01/23     losartan  (COZAAR ) 100 MG tablet Take 1  tablet (100 mg total) by mouth daily. 05/06/23     Magnesium 250 MG TABS Take 250 mg by mouth.    [provider]  mirabegron  ER (MYRBETRIQ ) 50 MG TB24 tablet Take 1 tablet (50 mg total) by mouth daily. 02/16/23     modafinil  (PROVIGIL ) 200 MG tablet Take 1 tablet (200 mg) by mouth daily. 07/16/22   Sater, Charlie LABOR, MD  modafinil  (PROVIGIL ) 200 MG tablet TAKE 1 TABLET BY MOUTH DAILY 07/02/23   Onita Duos, MD  neomycin -polymyxin-hydrocortisone (CORTISPORIN ) 3.5-10000-1 OTIC suspension Apply 1-2 drops daily after soaking and cover with bandaid 09/23/23   Lamount Ethan CROME, DPM  ondansetron  (ZOFRAN ) 4 MG tablet Take 1 tablet (4 mg total) by mouth every 8 (eight) hours as needed for nausea or vomiting. 10/11/14   Jenel Carlin POUR, MD  rosuvastatin  (CRESTOR ) 10 MG tablet Take 1 tablet (10 mg total) by mouth daily. 05/06/23     tamsulosin  (FLOMAX ) 0.4 MG CAPS capsule TAKE 1 CAPSULE BY MOUTH 2 TIMES DAILY 02/07/21     tamsulosin  (FLOMAX ) 0.4 MG CAPS capsule Take 1 capsule (0.4 mg total) by mouth 2 (two) times daily. 04/02/22     tamsulosin  (FLOMAX ) 0.4 MG CAPS capsule Take 1 capsule (0.4 mg total) by mouth 2 (two) times daily. 04/29/23     Tdap (BOOSTRIX ) 5-2.5-18.5 LF-MCG/0.5 injection Inject into the muscle. 05/08/21   Luiz Channel, MD  valACYclovir  (VALTREX ) 1000 MG tablet Take 1 tablet by mouth 3 times a day for 7 days 05/06/22     Vibegron  (GEMTESA ) 75 MG TABS Take 1 tablet (75 mg total) by mouth daily. 05/01/23     zolpidem  (AMBIEN ) 10 MG tablet Take 1 tablet (10 mg) by mouth at bedtime. 08/24/23   Sater, Charlie LABOR, MD    Physical Exam: BP 106/63 (BP Location: Left Arm)   Pulse (!) 103   Temp 98.5 F (36.9 C) (Oral)   Resp 16   Ht 5' 9 (1.753 m)   Wt 77.1 kg   SpO2 97%   BMI 25.10 kg/m   General: 68 y.o. year-old male well developed well nourished in no acute distress.  Alert and oriented x3. Cardiovascular: Regular rate and rhythm with no rubs or gallops.  No thyromegaly or JVD noted.  No  lower extremity edema. 2/4 pulses in all 4 extremities. Respiratory: Faint rales left upper lobe. Good inspiratory effort. Abdomen: Soft nontender nondistended with normal bowel sounds x4 quadrants. Muskuloskeletal: No cyanosis, clubbing or edema noted bilaterally Neuro: CN II-XII intact, strength, sensation, reflexes Skin: No ulcerative lesions noted or rashes Psychiatry: Judgement and insight appear normal. Mood is appropriate for condition and setting          Labs on Admission:  Basic Metabolic Panel: Recent Labs  Lab 11/08/23 0923  NA 139  K 3.9  CL 100  CO2 26  GLUCOSE 124*  BUN 15  CREATININE 1.26*  CALCIUM  10.0   Liver Function Tests: Recent Labs  Lab 11/08/23 [phone]  AST 20  ALT 23  ALKPHOS 84  BILITOT 0.5  PROT 7.5  ALBUMIN 3.8   No results for input(s): LIPASE, AMYLASE in the last 168 hours. No results for input(s): AMMONIA in the last 168 hours. CBC: Recent Labs  Lab 11/08/23 0923  WBC 20.4*  NEUTROABS 16.4*  HGB 13.2  HCT 40.3  MCV 86.7  PLT 303   Cardiac Enzymes: No results for input(s): CKTOTAL, CKMB, CKMBINDEX, TROPONINI in the last 168 hours.  BNP (last 3 results) No results for input(s): BNP in the last 8760 hours.  ProBNP (last 3 results) No results for input(s): PROBNP in the last 8760 hours.  CBG: No results for input(s): GLUCAP in the last 168 hours.  Radiological Exams on Admission: CT Chest W Contrast Result Date: 11/08/2023 CLINICAL DATA:  Pneumonia.  Abnormal chest radiograph EXAM: CT CHEST WITH CONTRAST TECHNIQUE: Multidetector CT imaging of the chest was performed during intravenous contrast administration. RADIATION DOSE REDUCTION: This exam was performed according to the departmental dose-optimization program which includes automated exposure control, adjustment of the mA and/or kV according to patient size and/or use of iterative reconstruction technique. CONTRAST:  80mL OMNIPAQUE IOHEXOL 300 MG/ML  SOLN  COMPARISON:  CT chest same day FINDINGS: Cardiovascular: No significant vascular findings. Normal heart size. No pericardial effusion. Mediastinum/Nodes: Enlarged prevascular lymph node measuring 13 mm on image 60. Trachea and esophagus are normal. No supraclavicular adenopathy. Lungs/Pleura: There is masslike consolidation in the medial aspect of the LEFT upper lobe abutting the pleural surface of the mediastinum measuring 5.4 x 5.1 cm (image 43/series 2). There is an adjacent thick-walled cavitary mass measuring 3.2 x 2.6 cm on image 54/series 3. Scattered ground-glass density and small nodules surround the LEFT upper lobe lesions. Mild thickening along the LEFT horizontal fissure. No pleural fluid. Upper Abdomen: Limited view of the liver, kidneys, pancreas are unremarkable. Normal adrenal glands. A single gallstone measuring 14 mm. No gallbladder inflammation. Musculoskeletal: No aggressive osseous lesion. IMPRESSION: 1. Adjacent LEFT upper lobe consolidative masses with cavitation are most suggestive of lobar pneumonia. Recommend short-term CT follow-up following appropriate antibiotic therapy to demonstrate improvement/resolution and exclude underlying malignancy. 2. Mild ipsilateral mediastinal adenopathy is favored reactive. Electronically Signed   By: Jackquline Boxer M.D.   On: 11/08/2023 11:13   DG Chest 2 View Result Date: 11/08/2023 CLINICAL DATA:  Pneumonia EXAM: CHEST - 2 VIEW COMPARISON:  No recent available comparison FINDINGS: Normal cardiac silhouette. Segmental opacity in the LEFT upper lobe most suggestive of airspace disease. Underlying mass cannot be excluded. No effusion or pneumothorax. No acute osseous abnormality. IMPRESSION: LEFT upper lobe pneumonia. Followup PA and lateral chest X-ray is recommended in 3-4 weeks following trial of antibiotic therapy to ensure resolution and exclude underlying malignancy. Electronically Signed   By: Jackquline Boxer M.D.   On: 11/08/2023 09:27     EKG: I independently viewed the EKG done and my findings are as followed: None obtained at the time of this visit.  Assessment/Plan Present on Admission:  CAP (community acquired pneumonia)  Principal Problem:   CAP (community acquired pneumonia)  Left upper lobe pneumonia with cavitation, POA Concern for developing early sepsis WBC 20.4 K, heart rate 103, consolidation seen on CT scan. Failed outpatient antibiotic therapy with Levaquin  x 4 days Lactic acid was reassuring 1.5. Follow peripheral blood cultures x 2 Follow sputum culture Follow MRSA screening test Start IV Zosyn, hold off IV vancomycin for now Florastor, probiotic, twice daily Continue IV fluid hydration per sepsis  protocol. Repeat CBC with procalcitonin in the morning. Maintain MAP greater than 65 Closely monitor vital signs. 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  Advised to have a short-term CT follow-up following appropriate antibiotic therapy to demonstrated improvement/resolution and exclude underlying malignancy.  The patient and his wife agreed with the plan.  Hypertension, BPs are currently soft Hold off home oral antihypertensives for now due to soft Bps Closely monitor vital signs  Hyperlipidemia Resume home Crestor   Situational insomnia Resume home Ambien   Multiple sclerosis Currently not on therapy Outpatient follow-up  BPH Resume home Flomax  Monitor urine output   Critical care time: 55 minutes.   DVT prophylaxis: Subcu Lovenox daily.  Code Status: Full code.  Family Communication: The patient's spouse, retired IM MD, at bedside.  Disposition Plan: Admitted to MedSurg unit, telemetry added.  Consults called: None.  Admission status: Inpatient status.   Status is: Inpatient The patient requires at least 2 midnights for further evaluation and treatment of present condition.   Antonio LOISE Hurst  MD [facility] Pager [phone]  If 7PM-7AM, please contact night-coverage [email or web address] Password [facility]  11/08/2023, 4:47 PM

## 2023-11-08 NOTE — ED Notes (Signed)
 Patient has declined transport by [employer] [facility]. His wife will transport him to [facility] after 2nd antibiotic is complete. Dr. Rogelia agrees to this plan.

## 2023-11-08 NOTE — Sepsis Progress Note (Signed)
 Sepsis protocol is being followed by [facility].

## 2023-11-08 NOTE — ED Provider Notes (Signed)
 [place] EM[facility] DEPARTMENT AT [facility] Provider Note   CSN: [id number] Arrival date & time: 11/08/23  0820     History Chief Complaint  Patient presents with   Fatigue    HPI: Antonio Cook is a 68 y.o. male with history pertinent hypertension, multiple sclerosis, recently diagnosed pneumonia who presents complaining of persistent fatigue. Patient arrived via POV accompanied by wife.  History provided by patient and spouse/partner.  No interpreter required during this encounter.  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  Patient reports that approximately 4 to 5 days ago he developed significant cough.  Reports that he had pleuritic chest wall pain associated with cough.  He has no chest wall pain associated with inspiration.  Reports that he went to his PCP, and had a chest x-ray demonstrating a left upper lobe infiltrate, and was started on Levaquin .  Patient expresses concern that he awoke from sleep approximately a week ago with a coughing spell, and believes that he may have had a aspiration event leading to his current symptoms.  Reports that he is currently on day 4 of antibiotics, and over the past several days he has had improvement in his cough with near resolution, also pleuritic chest wall pain has improved, however he has had persistent to worsening fatigue and malaise, and has had persistent fevers and chills.  Reports that he had Tmax 100.4 yesterday at home.  Reports that he discussed his symptoms with his colleagues and wanted to come to the emergency department for a reevaluation.  Reports that he had a negative COVID test at his PCP, denies having blood work performed previously.  Patient's recorded medical, surgical, social, medication list and allergies were reviewed in the Snapshot window as part of the initial  history.   Prior to Admission medications   Medication Sig Start Date End Date Taking? Authorizing Provider  ALPRAZolam  (XANAX ) 0.5 MG tablet Take 1 tablet (0.5 mg total) by mouth daily as needed for anxiety. 07/16/23  Yes Sater, Charlie LABOR, MD  Cholecalciferol (VITAMIN D3) 1000 units CAPS Take 1,000 Units by mouth at bedtime.   Yes [provider]  etodolac  (LODINE ) 200 MG capsule Take 1 capsule (200 mg total) by mouth every 8 (eight) hours with food as needed. Patient taking differently: Take 200 mg by mouth every 8 (eight) hours as needed (for pain). 05/06/23  Yes   finasteride  (PROSCAR ) 5 MG tablet Take 1 tablet (5 mg total) by mouth daily. 07/09/23  Yes   fluticasone  (FLONASE ) 50 MCG/ACT nasal spray Place 2 sprays into both nostrils daily. Patient taking differently: Place 2 sprays into both nostrils at bedtime. 11/05/23  Yes   HYDROcodone  bit-homatropine (HYCODAN) 5-1.5 MG/5ML syrup Take 5 mLs by mouth every 6 (six) hours as needed for cough 11/05/23  Yes   levofloxacin  (LEVAQUIN ) 500 MG tablet Take 1 tablet (500 mg total) by mouth daily. 11/05/23  Yes   losartan  (COZAAR ) 100 MG tablet Take 1 tablet (100 mg total) by mouth daily. 05/06/23  Yes   Magnesium 250 MG TABS Take 500 mg by mouth at bedtime.   Yes [provider]  mirabegron  ER (MYRBETRIQ ) 50 MG TB24 tablet Take 1 tablet (50 mg total) by mouth daily. Patient taking differently: Take 50 mg by mouth at bedtime. 02/16/23  Yes   modafinil  (PROVIGIL ) 200 MG tablet TAKE 1 TABLET BY  MOUTH DAILY Patient taking differently: Take 200 mg by mouth daily as needed (for drowsiness). 07/02/23  Yes Onita Duos, MD  ondansetron  (ZOFRAN ) 4 MG tablet Take 1 tablet (4 mg total) by mouth every 8 (eight) hours as needed for nausea or vomiting. 10/11/14  Yes Jenel Carlin POUR, MD  rosuvastatin  (CRESTOR ) 10 MG tablet Take 1 tablet (10 mg total) by mouth daily. Patient taking differently: Take 10 mg by mouth every other day. 05/06/23  Yes   tamsulosin   (FLOMAX ) 0.4 MG CAPS capsule Take 1 capsule (0.4 mg total) by mouth 2 (two) times daily. Patient taking differently: Take 0.4 mg by mouth at bedtime. 04/29/23  Yes   Vibegron  (GEMTESA ) 75 MG TABS Take 1 tablet (75 mg total) by mouth daily. 05/01/23  Yes   zolpidem  (AMBIEN ) 10 MG tablet Take 1 tablet (10 mg) by mouth at bedtime. 08/24/23  Yes Sater, Charlie LABOR, MD  halobetasol  (ULTRAVATE ) 0.05 % ointment Apply topically 2 times a day for 14 days. (NOT FOR FACE OR FOLDS) Patient not taking: Reported on 11/08/2023 01/27/23     losartan  (COZAAR ) 100 MG tablet TAKE 1 TABLET BY MOUTH AT BEDTIME Patient not taking: Reported on 11/08/2023 04/15/21     losartan  (COZAAR ) 100 MG tablet Take 1 tablet (100 mg total) by mouth daily. Patient not taking: Reported on 11/08/2023 04/01/23     modafinil  (PROVIGIL ) 200 MG tablet Take 1 tablet (200 mg) by mouth daily. Patient not taking: Reported on 11/08/2023 07/16/22   Vear Charlie LABOR, MD  neomycin -polymyxin-hydrocortisone (CORTISPORIN ) 3.5-10000-1 OTIC suspension Apply 1-2 drops daily after soaking and cover with bandaid Patient not taking: Reported on 11/08/2023 09/23/23   Lamount Ethan CROME, DPM  tamsulosin  (FLOMAX ) 0.4 MG CAPS capsule TAKE 1 CAPSULE BY MOUTH 2 TIMES DAILY Patient not taking: Reported on 11/08/2023 02/07/21     tamsulosin  (FLOMAX ) 0.4 MG CAPS capsule Take 1 capsule (0.4 mg total) by mouth 2 (two) times daily. Patient not taking: Reported on 11/08/2023 04/02/22     Tdap (BOOSTRIX ) 5-2.5-18.5 LF-MCG/0.5 injection Inject into the muscle. Patient not taking: Reported on 11/08/2023 05/08/21   Luiz Channel, MD  valACYclovir  (VALTREX ) 1000 MG tablet Take 1 tablet by mouth 3 times a day for 7 days Patient not taking: Reported on 11/08/2023 05/06/22        Allergies: Solifenacin, Gadolinium derivatives, and Hydrochlorothiazide   Review of Systems   ROS as per HPI  Physical Exam Updated Vital Signs BP 134/82 (BP Location: Right Arm)   Pulse 88   Temp 98.8 F (37.1  C) (Oral)   Resp 18   Ht 5' 9 (1.753 m)   Wt 77.1 kg   SpO2 98%   BMI 25.10 kg/m  Physical Exam Vitals and nursing note reviewed.  Constitutional:      General: He is not in acute distress.    Appearance: Normal appearance. He is well-developed.  HENT:     Head: Normocephalic and atraumatic.  Eyes:     Extraocular Movements: Extraocular movements intact.     Conjunctiva/sclera: Conjunctivae normal.  Cardiovascular:     Rate and Rhythm: Normal rate and regular rhythm.     Pulses: Normal pulses.          Radial pulses are 2+ on the right side and 2+ on the left side.     Heart sounds: No murmur heard. Pulmonary:     Effort: Pulmonary effort is normal. No tachypnea, accessory muscle usage or respiratory distress.  Breath sounds: No decreased breath sounds, wheezing or rhonchi.  Abdominal:     Palpations: Abdomen is soft.     Tenderness: There is no abdominal tenderness.  Musculoskeletal:        General: No swelling or tenderness (No calf tenderness bilaterally).     Cervical back: Neck supple.     Right lower leg: No edema.     Left lower leg: No edema.  Skin:    General: Skin is warm and dry.     Capillary Refill: Capillary refill takes less than 2 seconds.  Neurological:     General: No focal deficit present.     Mental Status: He is alert and oriented to person, place, and time.  Psychiatric:        Mood and Affect: Mood normal.     ED Course/ Medical Decision Making/ A&P    Procedures Procedures   Medications Ordered in ED Medications  enoxaparin (LOVENOX) injection 40 mg (40 mg Subcutaneous Patient Refused/Not Given 11/10/23 2210)  acetaminophen (TYLENOL) tablet 650 mg (650 mg Oral Given 11/09/23 1449)  prochlorperazine (COMPAZINE) injection 5 mg (has no administration in time range)  polyethylene glycol (MIRALAX / GLYCOLAX) packet 17 g (has no administration in time range)  piperacillin-tazobactam (ZOSYN) IVPB 3.375 g (3.375 g Intravenous New Bag/Given  11/11/23 0157)  saccharomyces boulardii (FLORASTOR) capsule 250 mg (250 mg Oral Given 11/10/23 2210)  magnesium oxide (MAG-OX) tablet 400 mg (400 mg Oral Given 11/10/23 2208)  rosuvastatin  (CRESTOR ) tablet 10 mg (10 mg Oral Given 11/10/23 0954)  tamsulosin  (FLOMAX ) capsule 0.4 mg (0.4 mg Oral Given 11/10/23 2208)  LORazepam (ATIVAN) tablet 0.5 mg (0.5 mg Oral Given 11/10/23 1218)  HYDROcodone  bit-homatropine (HYCODAN) 5-1.5 MG/5ML syrup 5 mL (has no administration in time range)  guaiFENesin-dextromethorphan (ROBITUSSIN DM) 100-10 MG/5ML syrup 5 mL (has no administration in time range)  ipratropium-albuterol (DUONEB) 0.5-2.5 (3) MG/3ML nebulizer solution 3 mL (has no administration in time range)  azithromycin (ZITHROMAX) tablet 500 mg (500 mg Oral Given 11/10/23 0955)  finasteride  (PROSCAR ) tablet 5 mg (5 mg Oral Given 11/10/23 0955)  losartan  (COZAAR ) tablet 100 mg (100 mg Oral Given 11/10/23 0955)  mirabegron  ER (MYRBETRIQ ) tablet 50 mg (50 mg Oral Given 11/10/23 2207)  melatonin tablet 5 mg (has no administration in time range)  pantoprazole (PROTONIX) EC tablet 40 mg (40 mg Oral Given 11/10/23 0955)  zolpidem  (AMBIEN ) tablet 10 mg (10 mg Oral Given 11/10/23 2208)  lactated ringers bolus 1,000 mL (0 mLs Intravenous Stopped 11/08/23 1148)  iohexol (OMNIPAQUE) 300 MG/ML solution 80 mL (80 mLs Intravenous Contrast Given 11/08/23 1054)  lactated ringers bolus 1,000 mL (0 mLs Intravenous Stopped 11/08/23 1439)  vancomycin (VANCOCIN) IVPB 1000 mg/200 mL premix (0 mg Intravenous Stopped 11/08/23 1439)  Ampicillin-Sulbactam (UNASYN) 3 g in sodium chloride 0.9 % 100 mL IVPB (0 g Intravenous Stopped 11/08/23 1407)  piperacillin-tazobactam (ZOSYN) IVPB 3.375 g (3.375 g Intravenous New Bag/Given 11/08/23 1705)    Medical Decision Making:   MOOSA BUECHE is a 68 y.o. male who presents for persistent malaise, fatigue, fever in the setting of recently diagnosed pneumonia on Levaquin  as per above.  Physical exam is  pertinent for lungs CTAB, vitally stable.   The differential includes but is not limited to sepsis, resistant pneumonia, concurrent viral infection, pleural effusion, hypoxic respiratory failure.  Independent historian: Spouse/partner  External data reviewed: No pertinent external data.  Recent PCP records unfortunately not available in Care Everywhere  Initial Plan:  Lactic acid  to evaluate for occult sepsis Screening labs including CBC and Metabolic panel to evaluate for infectious or metabolic etiology of disease.  COVID/flu/RSV testing CXR to evaluate for structural/infectious intrathoracic pathology.   Labs: Ordered, Independent interpretation, and Details: Lactic acid WNL.  COVID/flu/RSV negative, CMP with elevation of creatinine to 1.2 from baseline of 1-1.1, does not rise to the level of AKI.  No emergent electrolyte derangement or emergent LFT abnormality.  CBC with leukocytosis to 20.4 with left shift.  No anemia or thrombocytopenia.  Radiology: Ordered, Independent interpretation, Details: Chest x-ray with hazy opacification in the left upper lobe CT of the chest without overt mass lesion, there is opacification of the upper portion of the left lower lobe, with cavitary lesion, and All images reviewed independently.  Agree with radiology report at this time.   CT Chest W Contrast Result Date: 11/08/2023 CLINICAL DATA:  Pneumonia.  Abnormal chest radiograph EXAM: CT CHEST WITH CONTRAST TECHNIQUE: Multidetector CT imaging of the chest was performed during intravenous contrast administration. RADIATION DOSE REDUCTION: This exam was performed according to the departmental dose-optimization program which includes automated exposure control, adjustment of the mA and/or kV according to patient size and/or use of iterative reconstruction technique. CONTRAST:  80mL OMNIPAQUE IOHEXOL 300 MG/ML  SOLN COMPARISON:  CT chest same day FINDINGS: Cardiovascular: No significant vascular findings. Normal  heart size. No pericardial effusion. Mediastinum/Nodes: Enlarged prevascular lymph node measuring 13 mm on image 60. Trachea and esophagus are normal. No supraclavicular adenopathy. Lungs/Pleura: There is masslike consolidation in the medial aspect of the LEFT upper lobe abutting the pleural surface of the mediastinum measuring 5.4 x 5.1 cm (image 43/series 2). There is an adjacent thick-walled cavitary mass measuring 3.2 x 2.6 cm on image 54/series 3. Scattered ground-glass density and small nodules surround the LEFT upper lobe lesions. Mild thickening along the LEFT horizontal fissure. No pleural fluid. Upper Abdomen: Limited view of the liver, kidneys, pancreas are unremarkable. Normal adrenal glands. A single gallstone measuring 14 mm. No gallbladder inflammation. Musculoskeletal: No aggressive osseous lesion. IMPRESSION: 1. Adjacent LEFT upper lobe consolidative masses with cavitation are most suggestive of lobar pneumonia. Recommend short-term CT follow-up following appropriate antibiotic therapy to demonstrate improvement/resolution and exclude underlying malignancy. 2. Mild ipsilateral mediastinal adenopathy is favored reactive. Electronically Signed   By: Jackquline Boxer M.D.   On: 11/08/2023 11:13   DG Chest 2 View Result Date: 11/08/2023 CLINICAL DATA:  Pneumonia EXAM: CHEST - 2 VIEW COMPARISON:  No recent available comparison FINDINGS: Normal cardiac silhouette. Segmental opacity in the LEFT upper lobe most suggestive of airspace disease. Underlying mass cannot be excluded. No effusion or pneumothorax. No acute osseous abnormality. IMPRESSION: LEFT upper lobe pneumonia. Followup PA and lateral chest X-ray is recommended in 3-4 weeks following trial of antibiotic therapy to ensure resolution and exclude underlying malignancy. Electronically Signed   By: Jackquline Boxer M.D.   On: 11/08/2023 09:27    EKG/Medicine tests: Not indicated EKG Interpretation:                  Interventions: Unasyn,  vancomycin, LR bolus  See the EMR for full details regarding lab and imaging results.  Patient presents for progression of fatigue and malaise and persistent fever in the setting of recent diagnosis of pneumonia on outpatient oral Levaquin .  Patient is overall well-appearing on exam, saturating well on room air.  Chest x-ray obtained and does have significant opacification, no direct comparison available as unfortunately chest x-ray from recent PCP visit is  not available in our chart, patient did not have labs obtained.  Leukocytosis to 20 is concerning, however again no direct comparison available.  Patient with curb 65 score of 1, due to age, therefore initially felt that outpatient management may be reasonable, however chest x-ray also discussed possibility of underlying malignancy, which was discussed with patient and wife at bedside, and via shared decision making elected to obtain CT of the chest with contrast to further evaluate.  Unfortunately this did not reveal whether or not patient has underlying mass, however did reveal cavitary lesions, therefore do feel that patient warrants admission for IV antibiotics.  Initiated on Unasyn as well as vancomycin, results discussed with patient and wife at bedside, hospitalist consulted for admission.  Patient accepted to inpatient bed, given vitally stable, not requiring supplemental oxygen, will be transported POV by wife to inpatient bed.  Presentation is most consistent with acute complicated illness  Discussion of management or test interpretations with external provider(s): Hospitalists  Risk Drugs:Prescription drug management Treatment: Decision regarding hospitalization  Disposition: ADMIT: I believe the patient requires admission for further care and management. The patient was admitted to hospitalists. Please see inpatient provider note for additional treatment plan details.   MDM generated using voice dictation software and may contain  dictation errors.  Please contact me for any clarification or with any questions.  Clinical Impression:  1. Cavitary pneumonia      Admit   Final Clinical Impression(s) / ED Diagnoses Final diagnoses:  Cavitary pneumonia    Rx / DC Orders ED Discharge Orders     None        Rogelia Jerilynn RAMAN, MD 11/11/23 [id number]

## 2023-11-09 DIAGNOSIS — J189 Pneumonia, unspecified organism: Secondary | ICD-10-CM | POA: Diagnosis not present

## 2023-11-09 LAB — BASIC METABOLIC PANEL WITH GFR
Anion gap: 12 (ref 5–15)
BUN: 12 mg/dL (ref 8–23)
CO2: 23 mmol/L (ref 22–32)
Calcium: 9 mg/dL (ref 8.9–10.3)
Chloride: 100 mmol/L (ref 98–111)
Creatinine, Ser: 1.13 mg/dL (ref 0.61–1.24)
GFR, Estimated: >60 mL/min (ref >60)
Glucose, Bld: 122 mg/dL — ABNORMAL HIGH (ref 70–99)
Potassium: 3.8 mmol/L (ref 3.5–5.1)
Sodium: 135 mmol/L (ref 135–145)

## 2023-11-09 LAB — CBC
HCT: 35.1 % — ABNORMAL LOW (ref 39.0–52.0)
Hemoglobin: 11.5 g/dL — ABNORMAL LOW (ref 13.0–17.0)
MCH: 28.5 pg (ref 26.0–34.0)
MCHC: 32.8 g/dL (ref 30.0–36.0)
MCV: 86.9 fL (ref 80.0–100.0)
Platelets: 280 K/uL (ref 150–400)
RBC: 4.04 MIL/uL — ABNORMAL LOW (ref 4.22–5.81)
RDW: 12.6 % (ref 11.5–15.5)
WBC: 20.6 K/uL — ABNORMAL HIGH (ref 4.0–10.5)
nRBC: 0 % (ref 0.0–0.2)

## 2023-11-09 LAB — HIV ANTIBODY (ROUTINE TESTING W REFLEX): HIV Screen 4th Generation wRfx: NONREACTIVE

## 2023-11-09 LAB — MAGNESIUM: Magnesium: 1.9 mg/dL (ref 1.7–2.4)

## 2023-11-09 LAB — PHOSPHORUS: Phosphorus: 3.5 mg/dL (ref 2.5–4.6)

## 2023-11-09 LAB — PROCALCITONIN: Procalcitonin: <0.1 ng/mL

## 2023-11-09 MED ORDER — LOSARTAN POTASSIUM 50 MG PO TABS
100.0000 mg | ORAL_TABLET | Freq: Every day | ORAL | Status: DC
  Administered 2023-11-09 – 2023-11-11 (×3): 100 mg via ORAL
  Filled 2023-11-09 (×3): qty 2

## 2023-11-09 MED ORDER — MIRABEGRON ER 25 MG PO TB24
50.0000 mg | ORAL_TABLET | Freq: Every day | ORAL | Status: DC
  Administered 2023-11-09 – 2023-11-10 (×2): 50 mg via ORAL
  Filled 2023-11-09 (×2): qty 2

## 2023-11-09 MED ORDER — ZOLPIDEM TARTRATE 5 MG PO TABS
10.0000 mg | ORAL_TABLET | Freq: Every evening | ORAL | Status: DC | PRN
  Administered 2023-11-09 – 2023-11-10 (×2): 10 mg via ORAL
  Filled 2023-11-09 (×2): qty 2

## 2023-11-09 MED ORDER — FINASTERIDE 5 MG PO TABS
5.0000 mg | ORAL_TABLET | Freq: Every day | ORAL | Status: DC
  Administered 2023-11-09 – 2023-11-11 (×3): 5 mg via ORAL
  Filled 2023-11-09 (×3): qty 1

## 2023-11-09 MED ORDER — AZITHROMYCIN 250 MG PO TABS
500.0000 mg | ORAL_TABLET | Freq: Every day | ORAL | Status: DC
  Administered 2023-11-09 – 2023-11-11 (×3): 500 mg via ORAL
  Filled 2023-11-09 (×3): qty 2

## 2023-11-09 MED ORDER — ZOLPIDEM TARTRATE 5 MG PO TABS: 5.0000 mg | ORAL_TABLET | Freq: Every evening | ORAL | Status: DC | PRN

## 2023-11-09 MED ORDER — MELATONIN 5 MG PO TABS: 5.0000 mg | ORAL_TABLET | Freq: Every evening | ORAL | Status: DC | PRN

## 2023-11-09 MED ORDER — PANTOPRAZOLE SODIUM 40 MG PO TBEC
40.0000 mg | DELAYED_RELEASE_TABLET | Freq: Every day | ORAL | Status: DC
Start: 2023-11-09 — End: 2023-11-11
  Administered 2023-11-09 – 2023-11-11 (×3): 40 mg via ORAL
  Filled 2023-11-09 (×3): qty 1

## 2023-11-09 NOTE — Plan of Care (Signed)
  Problem: Education: Goal: Knowledge of General Education information will improve Description: Including pain rating scale, medication(s)/side effects and non-pharmacologic comfort measures Outcome: Progressing   Problem: Clinical Measurements: Goal: Ability to maintain clinical measurements within normal limits will improve Outcome: Progressing Goal: Will remain free from infection Outcome: Not Progressing Goal: Diagnostic test results will improve Outcome: Not Progressing

## 2023-11-09 NOTE — Plan of Care (Signed)

## 2023-11-09 NOTE — Progress Notes (Signed)
 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  No sick contacts.  Taking Levaquin  for the last 4 days with no improvement.  In the emergency room, tachycardic with heart rate 103, WBC count 20.4.  CT scan with left upper lobe consolidative masses with cavitation more suggestive of a lobar pneumonia.  Blood cultures were drawn.  Admitted with IV antibiotics.  Subjective: Patient seen and examined.  Has some dry cough.  Denies any other complaints.  Tmax 100 last 24 hours. On room air and able to move around.  Assessment & Plan:   Left upper lobe pneumonia with cavitation: Agree with IV antibiotics given severity of symptoms. Received 1 dose of vancomycin and Zosyn 9/28.  Currently on IV Zosyn. Will add azithromycin for atypical coverage for 5 days. Blood cultures, negative so far. Sputum cultures, not enough sample. MRSA PCR negative. Chest physiotherapy, incentive spirometry, deep breathing exercises, sputum induction, mucolytic's. Mobilize around.  Monitor white cell count. Given significant consolidation and cavitation changes, will need follow-up CT scan in 3 to 4 weeks to ensure complete resolution.  Chronic medical issues, Essential hypertension, blood pressure is stable.  Can resume home medications. Hyperlipidemia, on Crestor .  Continue. Insomnia, on Ambien  and melatonin.  Continue. BPH, on Flomax .  Continue.    DVT prophylaxis: enoxaparin (LOVENOX) injection 40 mg Start: 11/08/23 2200   Code Status: Full code Family Communication: None at the bedside Disposition Plan: Status is: Inpatient Remains inpatient appropriate because: IV antibiotics     Consultants:  None  Procedures:  None  Antimicrobials:   Zosyn 9/28-- Azithromycin 9/29---     Objective: Vitals:   11/08/23 2130 11/09/23 0200 11/09/23 0557 11/09/23 0920  BP: 112/71 122/87 124/75 130/84  Pulse: 95 91 81 84  Resp: 18 16 16 18   Temp: (!) 100.5 F (38.1 C) 98.7 F (37.1 C) 98.6 F (37 C) 98.5 F (36.9 C)  TempSrc: Oral Oral Oral Oral  SpO2: 96% 98% 96% 97%  Weight:      Height:        Intake/Output Summary (Last 24 hours) at 11/09/2023 1057 Last data filed at 11/09/2023 0934 Gross per 24 hour  Intake 6207.29 ml  Output 300 ml  Net 5907.29 ml   Filed Weights   11/08/23 0829  Weight: 77.1 kg    Examination:  General exam: Appears calm and comfortable.  Pleasant interactive. Respiratory system: Clear to auscultation. Respiratory effort normal.  No added sounds.  On room air. Cardiovascular system: S1 & S2 heard, RRR. No JVD, murmurs, rubs, gallops or clicks. No pedal edema. Gastrointestinal system: Soft.  Nontender.  Bowel sound present. Central nervous system: Alert and oriented. No focal neurological deficits. Extremities: Symmetric 5 x 5 power. Skin: No rashes, lesions or ulcers Psychiatry: Judgement and insight appear normal. Mood & affect appropriate.     Data Reviewed: I have personally reviewed following labs and imaging studies  CBC: Recent Labs  Lab 11/08/23 0923 11/09/23 0421  WBC 20.4* 20.6*  NEUTROABS 16.4*  --   HGB 13.2 11.5*  HCT 40.3 35.1*  MCV 86.7 86.9  PLT 303 280   Basic Metabolic Panel: Recent Labs  Lab 11/08/23 0923 11/09/23 0421  NA 139 135  K 3.9 3.8  CL 100 100  CO2 26 23  GLUCOSE 124* 122*  BUN 15 12  CREATININE 1.26* 1.13  CALCIUM  10.0 9.0  MG  --  1.9  PHOS  --  3.5   GFR: Estimated Creatinine Clearance: 61.7 mL/min (by C-G formula based on SCr of 1.13 mg/dL). Liver Function Tests: Recent Labs  Lab 11/08/23 0923  AST 20  ALT 23  ALKPHOS 84  BILITOT 0.5  PROT 7.5  ALBUMIN 3.8   No results for input(s): LIPASE, AMYLASE in the last 168  hours. No results for input(s): AMMONIA in the last 168 hours. Coagulation Profile: No results for input(s): INR, PROTIME in the last 168 hours. Cardiac Enzymes: No results for input(s): CKTOTAL, CKMB, CKMBINDEX, TROPONINI in the last 168 hours. BNP (last 3 results) No results for input(s): PROBNP in the last 8760 hours. HbA1C: No results for input(s): HGBA1C in the last 72 hours. CBG: No results for input(s): GLUCAP in the last 168 hours. Lipid Profile: No results for input(s): CHOL, HDL, LDLCALC, TRIG, CHOLHDL, LDLDIRECT in the last 72 hours. Thyroid Function Tests: No results for input(s): TSH, T4TOTAL, FREET4, T3FREE, THYROIDAB in the last 72 hours. Anemia Panel: No results for input(s): VITAMINB12, FOLATE, FERRITIN, TIBC, IRON, RETICCTPCT in the last 72 hours. Sepsis Labs: Recent Labs  Lab 11/08/23 0923 11/09/23 0421  PROCALCITON  --  <0.10  LATICACIDVEN 1.5  --     Recent Results (from the past 240 hours)  Resp panel by RT-PCR (RSV, Flu A&B, Covid) Anterior Nasal Swab     Status: None   Collection Time: 11/08/23  9:23 AM   Specimen: Anterior Nasal Swab  Result Value Ref Range Status   SARS Coronavirus 2 by RT PCR NEGATIVE NEGATIVE Final    Comment: (NOTE) SARS-CoV-2 target nucleic acids are NOT DETECTED.  The SARS-CoV-2 RNA is generally detectable in upper respiratory specimens during the acute phase of infection. The lowest concentration of SARS-CoV-2 viral copies this assay can detect is 138 copies/mL. A negative result does not preclude SARS-Cov-2 infection and should not be used as the sole basis for treatment or other patient management decisions. A negative result may occur with  improper specimen collection/handling, submission of specimen other than nasopharyngeal swab, presence of viral mutation(s) within the areas targeted by this assay, and inadequate number of viral copies(<138 copies/mL). A negative  result must be combined with clinical observations, patient history, and epidemiological information. The expected result is Negative.  Fact Sheet for Patients:  BloggerCourse.com  Fact Sheet for Healthcare Providers:  SeriousBroker.it  This test is no t yet approved or cleared by the [place]  FDA and  has been authorized for detection and/or diagnosis of SARS-CoV-2 by FDA under an Emergency Use Authorization (EUA). This EUA will remain  in effect (meaning this test can be used) for the duration of the COVID-19 declaration under Section 564(b)(1) of the Act, 21 U.S.C.section 360bbb-3(b)(1), unless the authorization is terminated  or revoked sooner.       Influenza A by PCR NEGATIVE NEGATIVE Final   Influenza B by PCR NEGATIVE NEGATIVE Final    Comment: (NOTE) The Xpert Xpress SARS-CoV-2/FLU/RSV plus assay is intended as an aid in the diagnosis of influenza from Nasopharyngeal swab specimens and should not be used as a sole basis for treatment. Nasal washings and aspirates are unacceptable for Xpert Xpress SARS-CoV-2/FLU/RSV testing.  Fact Sheet for Patients: BloggerCourse.com  Fact Sheet for Healthcare Providers: SeriousBroker.it  This test is not yet approved or cleared by the [place]  FDA  and has been authorized for detection and/or diagnosis of SARS-CoV-2 by FDA under an Emergency Use Authorization (EUA). This EUA will remain in effect (meaning this test can be used) for the duration of the COVID-19 declaration under Section 564(b)(1) of the Act, 21 U.S.C. section 360bbb-3(b)(1), unless the authorization is terminated or revoked.     Resp Syncytial Virus by PCR NEGATIVE NEGATIVE Final    Comment: (NOTE) Fact Sheet for Patients: BloggerCourse.com  Fact Sheet for Healthcare Providers: SeriousBroker.it  This  test is not yet approved or cleared by the [place]  FDA and has been authorized for detection and/or diagnosis of SARS-CoV-2 by FDA under an Emergency Use Authorization (EUA). This EUA will remain in effect (meaning this test can be used) for the duration of the COVID-19 declaration under Section 564(b)(1) of the Act, 21 U.S.C. section 360bbb-3(b)(1), unless the authorization is terminated or revoked.  Performed at [facility], [address] [place], [place] [address]   MRSA Next Gen by PCR, Nasal     Status: None   Collection Time: 11/08/23  4:52 PM   Specimen: Nasal Mucosa; Nasal Swab  Result Value Ref Range Status   MRSA by PCR Next Gen NOT DETECTED NOT DETECTED Final    Comment: (NOTE) The GeneXpert MRSA Assay (FDA approved for NASAL specimens only), is one component of a comprehensive MRSA colonization surveillance program. It is not intended to diagnose MRSA infection nor to guide or monitor treatment for MRSA infections. Test performance is not FDA approved in patients less than 42 years old. Performed at [facility], [address], [place], [place] [address]   Culture, blood (Routine X 2) w Reflex to ID Panel     Status: None (Preliminary result)   Collection Time: 11/08/23  5:00 PM   Specimen: BLOOD LEFT ARM  Result Value Ref Range Status   Specimen Description   Final    BLOOD LEFT ARM Performed at [facility], [address], [place], [place] [address]    Special Requests   Final    BOTTLES DRAWN AEROBIC AND ANAEROBIC Blood Culture results may not be optimal due to an inadequate volume of blood received in culture bottles Performed at [facility], [address] [place], [place] [address]    Culture PENDING  Incomplete   Report Status PENDING  Incomplete         Radiology Studies: CT Chest W Contrast Result Date: 11/08/2023 CLINICAL DATA:  Pneumonia.  Abnormal chest radiograph EXAM:  CT CHEST WITH CONTRAST TECHNIQUE: Multidetector CT imaging of the chest was performed during intravenous contrast administration. RADIATION DOSE REDUCTION: This exam was performed according to the departmental dose-optimization program which includes automated exposure control, adjustment of the mA and/or kV according to patient size and/or use of iterative reconstruction technique. CONTRAST:  80mL OMNIPAQUE IOHEXOL 300 MG/ML  SOLN COMPARISON:  CT chest same day FINDINGS: Cardiovascular: No significant vascular findings. Normal heart size. No pericardial effusion. Mediastinum/Nodes: Enlarged prevascular lymph node measuring 13 mm on image 60. Trachea and esophagus are normal. No supraclavicular adenopathy. Lungs/Pleura: There is masslike consolidation in the medial aspect of the LEFT upper lobe abutting the pleural surface of the mediastinum measuring 5.4 x 5.1 cm (image 43/series 2). There is an adjacent thick-walled cavitary mass measuring 3.2 x 2.6 cm on image 54/series 3. Scattered ground-glass density and small nodules surround the LEFT upper lobe lesions. Mild thickening along the LEFT horizontal fissure. No pleural fluid. Upper Abdomen:  Limited view of the liver, kidneys, pancreas are unremarkable. Normal adrenal glands. A single gallstone measuring 14 mm. No gallbladder inflammation. Musculoskeletal: No aggressive osseous lesion. IMPRESSION: 1. Adjacent LEFT upper lobe consolidative masses with cavitation are most suggestive of lobar pneumonia. Recommend short-term CT follow-up following appropriate antibiotic therapy to demonstrate improvement/resolution and exclude underlying malignancy. 2. Mild ipsilateral mediastinal adenopathy is favored reactive. Electronically Signed   By: Jackquline Boxer M.D.   On: 11/08/2023 11:13   DG Chest 2 View Result Date: 11/08/2023 CLINICAL DATA:  Pneumonia EXAM: CHEST - 2 VIEW COMPARISON:  No recent available comparison FINDINGS: Normal cardiac silhouette. Segmental  opacity in the LEFT upper lobe most suggestive of airspace disease. Underlying mass cannot be excluded. No effusion or pneumothorax. No acute osseous abnormality. IMPRESSION: LEFT upper lobe pneumonia. Followup PA and lateral chest X-ray is recommended in 3-4 weeks following trial of antibiotic therapy to ensure resolution and exclude underlying malignancy. Electronically Signed   By: Jackquline Boxer M.D.   On: 11/08/2023 09:27        Scheduled Meds:  azithromycin  500 mg Oral Daily   enoxaparin (LOVENOX) injection  40 mg Subcutaneous QHS   finasteride   5 mg Oral Daily   losartan   100 mg Oral Daily   magnesium oxide  400 mg Oral QHS   mirabegron  ER  50 mg Oral QHS   rosuvastatin   10 mg Oral Daily   saccharomyces boulardii  250 mg Oral BID   tamsulosin   0.4 mg Oral QHS   Continuous Infusions:  piperacillin-tazobactam (ZOSYN)  IV 3.375 g (11/09/23 0926)     LOS: 1 day    Time spent: 50 minutes    Renato Applebaum, MD [facility]

## 2023-11-10 DIAGNOSIS — J189 Pneumonia, unspecified organism: Secondary | ICD-10-CM | POA: Diagnosis not present

## 2023-11-10 LAB — CBC WITH DIFFERENTIAL/PLATELET
Abs Immature Granulocytes: 0.5 K/uL — ABNORMAL HIGH (ref 0.00–0.07)
Basophils Absolute: 0.1 K/uL (ref 0.0–0.1)
Basophils Relative: 1 %
Eosinophils Absolute: 0.4 K/uL (ref 0.0–0.5)
Eosinophils Relative: 2 %
HCT: 34.9 % — ABNORMAL LOW (ref 39.0–52.0)
Hemoglobin: 10.9 g/dL — ABNORMAL LOW (ref 13.0–17.0)
Immature Granulocytes: 3 %
Lymphocytes Relative: 11 %
Lymphs Abs: 2.1 K/uL (ref 0.7–4.0)
MCH: 27.4 pg (ref 26.0–34.0)
MCHC: 31.2 g/dL (ref 30.0–36.0)
MCV: 87.7 fL (ref 80.0–100.0)
Monocytes Absolute: 2.5 K/uL — ABNORMAL HIGH (ref 0.1–1.0)
Monocytes Relative: 14 %
Neutro Abs: 13 K/uL — ABNORMAL HIGH (ref 1.7–7.7)
Neutrophils Relative %: 69 %
Platelets: 280 K/uL (ref 150–400)
RBC: 3.98 MIL/uL — ABNORMAL LOW (ref 4.22–5.81)
RDW: 12.4 % (ref 11.5–15.5)
WBC: 18.6 K/uL — ABNORMAL HIGH (ref 4.0–10.5)
nRBC: 0 % (ref 0.0–0.2)

## 2023-11-10 NOTE — Progress Notes (Signed)
   11/10/23 1513  TOC Brief Assessment  Insurance and Status Reviewed  Patient has primary care physician Yes  Home environment has been reviewed home with spouse  Prior level of function: independent  Prior/Current Home Services No current home services  Social Drivers of Health Review SDOH reviewed no interventions necessary  Readmission risk has been reviewed Yes  Transition of care needs no transition of care needs at this time

## 2023-11-10 NOTE — Plan of Care (Signed)

## 2023-11-10 NOTE — Progress Notes (Signed)
 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  No sick contacts.  Taking Levaquin  for the last 4 days with no improvement.  In the emergency room, tachycardic with heart rate 103, WBC count 20.4.  CT scan with left upper lobe consolidative masses with cavitation more suggestive of a lobar pneumonia.  Blood cultures were drawn.  Admitted with IV antibiotics.  Subjective: Patient seen and examined.  Denies any complaints today.  Temperature maximum 100.4 last 24 hours.  Overnight afebrile.  Occasional dry cough.  Assessment & Plan:   Left upper lobe pneumonia with cavitation: Will continue IV antibiotics with Zosyn and oral azithromycin today. Blood cultures negative. Blood cultures, negative so far. Sputum cultures, not enough sample. MRSA PCR negative. Chest physiotherapy, incentive spirometry, deep breathing exercises, sputum induction, mucolytic's. Mobilize around.  Monitor white cell count. Given significant consolidation and cavitation changes, will need follow-up CT scan in 3 to 4 weeks to ensure complete resolution. Anticipating home with 2 weeks of oral antibiotics.   Chronic medical issues, Essential hypertension, blood pressure is stable.  Can continue home medications. Hyperlipidemia, on Crestor .  Continue. Insomnia, on Ambien  and melatonin.  Continue. BPH, on Flomax .  Continue.    DVT prophylaxis: enoxaparin (LOVENOX) injection 40 mg Start: 11/08/23 2200   Code Status: Full code Family Communication: wife at the bedside Disposition Plan: Status is: Inpatient Remains inpatient appropriate because: IV antibiotics     Consultants:  None  Procedures:  None  Antimicrobials:  Zosyn 9/28-- Azithromycin  9/29---     Objective: Vitals:   11/09/23 1500 11/09/23 2227 11/10/23 0521 11/10/23 1100  BP:  126/84 105/66 108/72  Pulse:  83 (!) 101 87  Resp:  18 18 16   Temp: 99.8 F (37.7 C) 98.8 F (37.1 C) 99.2 F (37.3 C) 98.2 F (36.8 C)  TempSrc: Oral Oral Oral Oral  SpO2:  97% 96% 96%  Weight:      Height:        Intake/Output Summary (Last 24 hours) at 11/10/2023 1236 Last data filed at 11/10/2023 1200 Gross per 24 hour  Intake 1255.06 ml  Output 300 ml  Net 955.06 ml   Filed Weights   11/08/23 0829  Weight: 77.1 kg    Examination:  General exam: Pleasant interactive.  On room air.  Walking around. Respiratory system: No added sounds. Cardiovascular system: S1 & S2 heard, RRR. No JVD, murmurs, rubs, gallops or clicks. No pedal edema. Gastrointestinal system: Soft.  Nontender.  Bowel sound present. Central nervous system: Alert and oriented. No focal neurological deficits. Extremities: Symmetric 5 x 5 power. Skin: No rashes, lesions or ulcers Psychiatry: Judgement and insight appear normal. Mood & affect appropriate.     Data Reviewed: I have personally reviewed following labs and imaging studies  CBC: Recent Labs  Lab 11/08/23 0923 11/09/23 0421 11/10/23 0508  WBC 20.4* 20.6* 18.6*  NEUTROABS 16.4*  --  13.0*  HGB 13.2 11.5* 10.9*  HCT 40.3 35.1* 34.9*  MCV 86.7 86.9 87.7  PLT 303 280 280   Basic Metabolic Panel: Recent Labs  Lab 11/08/23 0923 11/09/23 0421  NA 139 135  K 3.9 3.8  CL 100 100  CO2 26 23  GLUCOSE 124* 122*  BUN 15 12  CREATININE 1.26*  1.13  CALCIUM  10.0 9.0  MG  --  1.9  PHOS  --  3.5   GFR: Estimated Creatinine Clearance: 61.7 mL/min (by C-G formula based on SCr of 1.13 mg/dL). Liver Function Tests: Recent Labs  Lab 11/08/23 0923  AST 20  ALT 23  ALKPHOS 84  BILITOT 0.5  PROT 7.5  ALBUMIN 3.8   No results for input(s): LIPASE, AMYLASE in the last 168 hours. No results for input(s): AMMONIA in the last 168  hours. Coagulation Profile: No results for input(s): INR, PROTIME in the last 168 hours. Cardiac Enzymes: No results for input(s): CKTOTAL, CKMB, CKMBINDEX, TROPONINI in the last 168 hours. BNP (last 3 results) No results for input(s): PROBNP in the last 8760 hours. HbA1C: No results for input(s): HGBA1C in the last 72 hours. CBG: No results for input(s): GLUCAP in the last 168 hours. Lipid Profile: No results for input(s): CHOL, HDL, LDLCALC, TRIG, CHOLHDL, LDLDIRECT in the last 72 hours. Thyroid Function Tests: No results for input(s): TSH, T4TOTAL, FREET4, T3FREE, THYROIDAB in the last 72 hours. Anemia Panel: No results for input(s): VITAMINB12, FOLATE, FERRITIN, TIBC, IRON, RETICCTPCT in the last 72 hours. Sepsis Labs: Recent Labs  Lab 11/08/23 0923 11/09/23 0421  PROCALCITON  --  <0.10  LATICACIDVEN 1.5  --     Recent Results (from the past 240 hours)  Resp panel by RT-PCR (RSV, Flu A&B, Covid) Anterior Nasal Swab     Status: None   Collection Time: 11/08/23  9:23 AM   Specimen: Anterior Nasal Swab  Result Value Ref Range Status   SARS Coronavirus 2 by RT PCR NEGATIVE NEGATIVE Final    Comment: (NOTE) SARS-CoV-2 target nucleic acids are NOT DETECTED.  The SARS-CoV-2 RNA is generally detectable in upper respiratory specimens during the acute phase of infection. The lowest concentration of SARS-CoV-2 viral copies this assay can detect is 138 copies/mL. A negative result does not preclude SARS-Cov-2 infection and should not be used as the sole basis for treatment or other patient management decisions. A negative result may occur with  improper specimen collection/handling, submission of specimen other than nasopharyngeal swab, presence of viral mutation(s) within the areas targeted by this assay, and inadequate number of viral copies(<138 copies/mL). A negative result must be combined with clinical observations,  patient history, and epidemiological information. The expected result is Negative.  Fact Sheet for Patients:  BloggerCourse.com  Fact Sheet for Healthcare Providers:  SeriousBroker.it  This test is no t yet approved or cleared by the [place]  FDA and  has been authorized for detection and/or diagnosis of SARS-CoV-2 by FDA under an Emergency Use Authorization (EUA). This EUA will remain  in effect (meaning this test can be used) for the duration of the COVID-19 declaration under Section 564(b)(1) of the Act, 21 U.S.C.section 360bbb-3(b)(1), unless the authorization is terminated  or revoked sooner.       Influenza A by PCR NEGATIVE NEGATIVE Final   Influenza B by PCR NEGATIVE NEGATIVE Final    Comment: (NOTE) The Xpert Xpress SARS-CoV-2/FLU/RSV plus assay is intended as an aid in the diagnosis of influenza from Nasopharyngeal swab specimens and should not be used as a sole basis for treatment. Nasal washings and aspirates are unacceptable for Xpert Xpress SARS-CoV-2/FLU/RSV testing.  Fact Sheet for Patients: BloggerCourse.com  Fact Sheet for Healthcare Providers: SeriousBroker.it  This test is not yet approved or cleared by the [place]  FDA and has been authorized for detection and/or diagnosis of SARS-CoV-2 by FDA under an  Emergency Use Authorization (EUA). This EUA will remain in effect (meaning this test can be used) for the duration of the COVID-19 declaration under Section 564(b)(1) of the Act, 21 U.S.C. section 360bbb-3(b)(1), unless the authorization is terminated or revoked.     Resp Syncytial Virus by PCR NEGATIVE NEGATIVE Final    Comment: (NOTE) Fact Sheet for Patients: BloggerCourse.com  Fact Sheet for Healthcare Providers: SeriousBroker.it  This test is not yet approved or cleared by the [place] FDA and has been authorized for detection and/or diagnosis of SARS-CoV-2 by FDA under an Emergency Use Authorization (EUA). This EUA will remain in effect (meaning this test can be used) for the duration of the COVID-19 declaration under Section 564(b)(1) of the Act, 21 U.S.C. section 360bbb-3(b)(1), unless the authorization is terminated or revoked.  Performed at [facility], [address] [place], [place] [address]   MRSA Next Gen by PCR, Nasal     Status: None   Collection Time: 11/08/23  4:52 PM   Specimen: Nasal Mucosa; Nasal Swab  Result Value Ref Range Status   MRSA by PCR Next Gen NOT DETECTED NOT DETECTED Final    Comment: (NOTE) The GeneXpert MRSA Assay (FDA approved for NASAL specimens only), is one component of a comprehensive MRSA colonization surveillance program. It is not intended to diagnose MRSA infection nor to guide or monitor treatment for MRSA infections. Test performance is not FDA approved in patients less than 78 years old. Performed at [facility], [address], [place], [place] [address]   Culture, blood (Routine X 2) w Reflex to ID Panel     Status: None (Preliminary result)   Collection Time: 11/08/23  5:00 PM   Specimen: BLOOD LEFT ARM  Result Value Ref Range Status   Specimen Description   Final    BLOOD LEFT ARM Performed at [facility], [address] [place], [place] [address]    Special Requests   Final    BOTTLES DRAWN AEROBIC AND ANAEROBIC Blood Culture results may not be optimal due to an inadequate volume of blood received in culture bottles Performed at [facility], [address], [place], [place] [address]    Culture   Final    NO GROWTH 1 DAY Performed at [facility], [address] [place], [place] [address]    Report Status PENDING  Incomplete         Radiology Studies: No results found.       Scheduled Meds:  azithromycin  500  mg Oral Daily   enoxaparin (LOVENOX) injection  40 mg Subcutaneous QHS   finasteride   5 mg Oral Daily   losartan   100 mg Oral Daily   magnesium oxide  400 mg Oral QHS   mirabegron  ER  50 mg Oral QHS   pantoprazole  40 mg Oral Daily   rosuvastatin   10 mg Oral Daily   saccharomyces boulardii  250 mg Oral BID   tamsulosin   0.4 mg Oral QHS   Continuous Infusions:  piperacillin-tazobactam (ZOSYN)  IV 3.375 g (11/10/23 0959)     LOS: 2 days       Renato Applebaum, MD [facility]

## 2023-11-11 ENCOUNTER — Other Ambulatory Visit (HOSPITAL_COMMUNITY): Payer: Self-pay

## 2023-11-11 DIAGNOSIS — J189 Pneumonia, unspecified organism: Secondary | ICD-10-CM | POA: Diagnosis not present

## 2023-11-11 LAB — CBC WITH DIFFERENTIAL/PLATELET
Abs Immature Granulocytes: 0.59 K/uL — ABNORMAL HIGH (ref 0.00–0.07)
Basophils Absolute: 0.2 K/uL — ABNORMAL HIGH (ref 0.0–0.1)
Basophils Relative: 1 %
Eosinophils Absolute: 0.4 K/uL (ref 0.0–0.5)
Eosinophils Relative: 2 %
HCT: 40.7 % (ref 39.0–52.0)
Hemoglobin: 12.6 g/dL — ABNORMAL LOW (ref 13.0–17.0)
Immature Granulocytes: 3 %
Lymphocytes Relative: 14 %
Lymphs Abs: 2.8 K/uL (ref 0.7–4.0)
MCH: 27.2 pg (ref 26.0–34.0)
MCHC: 31 g/dL (ref 30.0–36.0)
MCV: 87.9 fL (ref 80.0–100.0)
Monocytes Absolute: 2.8 K/uL — ABNORMAL HIGH (ref 0.1–1.0)
Monocytes Relative: 14 %
Neutro Abs: 13.7 K/uL — ABNORMAL HIGH (ref 1.7–7.7)
Neutrophils Relative %: 66 %
Platelets: 321 K/uL (ref 150–400)
RBC: 4.63 MIL/uL (ref 4.22–5.81)
RDW: 12.4 % (ref 11.5–15.5)
WBC: 20.5 K/uL — ABNORMAL HIGH (ref 4.0–10.5)
nRBC: 0 % (ref 0.0–0.2)

## 2023-11-11 LAB — MAGNESIUM: Magnesium: 2.2 mg/dL (ref 1.7–2.4)

## 2023-11-11 LAB — COMPREHENSIVE METABOLIC PANEL WITH GFR
ALT: 38 U/L (ref 0–44)
AST: 28 U/L (ref 15–41)
Albumin: 3.5 g/dL (ref 3.5–5.0)
Alkaline Phosphatase: 74 U/L (ref 38–126)
Anion gap: 14 (ref 5–15)
BUN: 12 mg/dL (ref 8–23)
CO2: 23 mmol/L (ref 22–32)
Calcium: 9.4 mg/dL (ref 8.9–10.3)
Chloride: 102 mmol/L (ref 98–111)
Creatinine, Ser: 1.17 mg/dL (ref 0.61–1.24)
GFR, Estimated: >60 mL/min (ref >60)
Glucose, Bld: 106 mg/dL — ABNORMAL HIGH (ref 70–99)
Potassium: 4 mmol/L (ref 3.5–5.1)
Sodium: 139 mmol/L (ref 135–145)
Total Bilirubin: 0.5 mg/dL (ref 0.0–1.2)
Total Protein: 7.3 g/dL (ref 6.5–8.1)

## 2023-11-11 MED ORDER — AMOXICILLIN-POT CLAVULANATE 875-125 MG PO TABS
1.0000 | ORAL_TABLET | Freq: Two times a day (BID) | ORAL | 0 refills | Status: AC
Start: 2023-11-11 — End: 2023-11-23
  Filled 2023-11-11: qty 24, 12d supply, fill #0

## 2023-11-11 MED ORDER — SACCHAROMYCES BOULARDII 250 MG PO CAPS
250.0000 mg | ORAL_CAPSULE | Freq: Two times a day (BID) | ORAL | 0 refills | Status: AC
  Filled 2023-11-11: qty 40, 20d supply, fill #0

## 2023-11-11 MED ORDER — AZITHROMYCIN 500 MG PO TABS
500.0000 mg | ORAL_TABLET | Freq: Every day | ORAL | 0 refills | Status: AC
  Filled 2023-11-11: qty 3, 3d supply, fill #0

## 2023-11-11 NOTE — Discharge Summary (Signed)
 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 No sick contacts. Taking Levaquin  for the last 4 days with no improvement. In the emergency room, tachycardic with heart rate 103, WBC count 20.4. CT scan with left upper lobe consolidative masses with cavitation more suggestive of a lobar pneumonia. Blood cultures were drawn. Admitted with IV antibiotics   Left upper lobe pneumonia with cavitation: Minimal pulmonary symptoms.  No sputum production.  Blood cultures were negative since admission.  PCR MRSA negative.  Viral panel negative. Initially low-grade fever and leukocytosis.  Fever defervesced.  Leukocytosis persistent. IV Zosyn for 48 hours, azithromycin for 48 hours. Significant clinical improvement now and mostly asymptomatic.  Procalcitonin less than 0.1. Plan: Given significant clinical improvement and patient's desire to go home, treating with Augmentin for additional 12 days to complete total 2 weeks of antibiotics. Azithromycin 500 mg daily, total 5 days of therapy. Given significant consolidation and cavitary changes, will need to repeat CT scan and or will bronchoscopy and biopsy to rule out lung cancer.  Will send referral to pulmonary for follow-up.  Also follow-up with primary care physician, repeat CBC and CT chest.   Chronic medical  issues, Essential hypertension, blood pressure is stable.  Can continue home medications. Hyperlipidemia, on Crestor .  Continue. Insomnia, on Ambien  and melatonin.  Continue. BPH, on Flomax .  Continue.  Stable to discharge with close outpatient follow-up.  Discharge Diagnoses:  Principal Problem:   CAP (community acquired pneumonia)    Discharge Instructions  Discharge Instructions     Call MD for:  difficulty breathing, headache or visual disturbances   Complete by: As directed    Call MD for:  temperature >100.4   Complete by: As directed    Diet - low sodium heart healthy   Complete by: As directed    Discharge instructions   Complete by: As directed    Repeat CBC in one week. Will need repeat Ct scan - in 2-3 weeks, please co ordinate with your Primary care physician. Will send referral to pulmonary also   Increase activity slowly   Complete by: As directed    Pulmonary Visit   Complete by: As directed    Consolidation - cavitation   Reason for referral: Other Pulmonary      Allergies as of 11/11/2023       Reactions   Solifenacin Other (See Comments)   Caused a very dry mouth   Gadolinium Derivatives Nausea And Vomiting, Other (See Comments)   Pt vomited after injection   Hydrochlorothiazide Rash, Dermatitis        Medication List     STOP taking these medications    Boostrix  5-2.5-18.5 LF-MCG/0.5 injection Generic drug: Tdap   halobetasol  0.05 % ointment Commonly known as: ULTRAVATE    levofloxacin  500 MG tablet Commonly known as: LEVAQUIN    neomycin -polymyxin-hydrocortisone 3.5-10000-1 OTIC suspension Commonly known as: CORTISPORIN    valACYclovir  1000 MG tablet Commonly known as: Valtrex   TAKE these medications    ALPRAZolam  0.5 MG tablet Commonly known as: XANAX  Take 1 tablet (0.5 mg total) by mouth daily as needed for anxiety.   amoxicillin-clavulanate 875-125 MG tablet Commonly known as: AUGMENTIN Take 1 tablet by mouth 2  (two) times daily for 12 days.   azithromycin 500 MG tablet Commonly known as: ZITHROMAX Take 1 tablet (500 mg total) by mouth daily for 3 days.   etodolac  200 MG capsule Commonly known as: LODINE  Take 1 capsule (200 mg total) by mouth every 8 (eight) hours with food as needed. What changed: reasons to take this   finasteride  5 MG tablet Commonly known as: PROSCAR  Take 1 tablet (5 mg total) by mouth daily.   Florastor 250 MG capsule Generic drug: saccharomyces boulardii Take 1 capsule (250 mg total) by mouth 2 (two) times daily.   fluticasone  50 MCG/ACT nasal spray Commonly known as: FLONASE  Place 2 sprays into both nostrils daily. What changed: when to take this   Gemtesa  75 MG Tabs Generic drug: Vibegron  Take 1 tablet (75 mg total) by mouth daily.   HYDROcodone  bit-homatropine 5-1.5 MG/5ML syrup Commonly known as: HYCODAN Take 5 mLs by mouth every 6 (six) hours as needed for cough   losartan  100 MG tablet Commonly known as: COZAAR  Take 1 tablet (100 mg total) by mouth daily. What changed: Another medication with the same name was removed. Continue taking this medication, and follow the directions you see here.   Magnesium 250 MG Tabs Take 500 mg by mouth at bedtime.   modafinil  200 MG tablet Commonly known as: PROVIGIL  TAKE 1 TABLET BY MOUTH DAILY What changed:  when to take this reasons to take this Another medication with the same name was removed. Continue taking this medication, and follow the directions you see here.   Myrbetriq  50 MG Tb24 tablet Generic drug: mirabegron  ER Take 1 tablet (50 mg total) by mouth daily. What changed: when to take this   ondansetron  4 MG tablet Commonly known as: Zofran  Take 1 tablet (4 mg total) by mouth every 8 (eight) hours as needed for nausea or vomiting.   rosuvastatin  10 MG tablet Commonly known as: CRESTOR  Take 1 tablet (10 mg total) by mouth daily. What changed: when to take this   tamsulosin  0.4 MG Caps  capsule Commonly known as: FLOMAX  Take 1 capsule (0.4 mg total) by mouth 2 (two) times daily. What changed:  when to take this Another medication with the same name was removed. Continue taking this medication, and follow the directions you see here.   Vitamin D3 1000 units Caps Take 1,000 Units by mouth at bedtime.   zolpidem  10 MG tablet Commonly known as: AMBIEN  Take 1 tablet (10 mg) by mouth at bedtime.        Allergies  Allergen Reactions   Solifenacin Other (See Comments)    Caused a very dry mouth   Gadolinium Derivatives Nausea And Vomiting and Other (See Comments)    Pt vomited after injection   Hydrochlorothiazide Rash and Dermatitis    Consultations: None   Procedures/Studies: CT Chest W Contrast Result Date: 11/08/2023 CLINICAL DATA:  Pneumonia.  Abnormal chest radiograph EXAM: CT CHEST WITH CONTRAST TECHNIQUE: Multidetector CT imaging of the chest was performed during intravenous contrast administration. RADIATION DOSE REDUCTION: This exam was performed according to the departmental dose-optimization program which includes automated exposure control, adjustment of the mA and/or kV according to patient size and/or use of iterative reconstruction technique. CONTRAST:  80mL OMNIPAQUE  IOHEXOL 300 MG/ML  SOLN COMPARISON:  CT chest same day FINDINGS: Cardiovascular: No significant vascular findings. Normal heart size. No pericardial effusion. Mediastinum/Nodes: Enlarged prevascular lymph node measuring 13 mm on image 60. Trachea and esophagus are normal. No supraclavicular adenopathy. Lungs/Pleura: There is masslike consolidation in the medial aspect of the LEFT upper lobe abutting the pleural surface of the mediastinum measuring 5.4 x 5.1 cm (image 43/series 2). There is an adjacent thick-walled cavitary mass measuring 3.2 x 2.6 cm on image 54/series 3. Scattered ground-glass density and small nodules surround the LEFT upper lobe lesions. Mild thickening along the LEFT  horizontal fissure. No pleural fluid. Upper Abdomen: Limited view of the liver, kidneys, pancreas are unremarkable. Normal adrenal glands. A single gallstone measuring 14 mm. No gallbladder inflammation. Musculoskeletal: No aggressive osseous lesion. IMPRESSION: 1. Adjacent LEFT upper lobe consolidative masses with cavitation are most suggestive of lobar pneumonia. Recommend short-term CT follow-up following appropriate antibiotic therapy to demonstrate improvement/resolution and exclude underlying malignancy. 2. Mild ipsilateral mediastinal adenopathy is favored reactive. Electronically Signed   By: Jackquline Boxer M.D.   On: 11/08/2023 11:13   DG Chest 2 View Result Date: 11/08/2023 CLINICAL DATA:  Pneumonia EXAM: CHEST - 2 VIEW COMPARISON:  No recent available comparison FINDINGS: Normal cardiac silhouette. Segmental opacity in the LEFT upper lobe most suggestive of airspace disease. Underlying mass cannot be excluded. No effusion or pneumothorax. No acute osseous abnormality. IMPRESSION: LEFT upper lobe pneumonia. Followup PA and lateral chest X-ray is recommended in 3-4 weeks following trial of antibiotic therapy to ensure resolution and exclude underlying malignancy. Electronically Signed   By: Jackquline Boxer M.D.   On: 11/08/2023 09:27   (Echo, Carotid, EGD, Colonoscopy, ERCP)    Subjective: Patient seen and examined.  Wife on the phone.  Afebrile last 24 hours.  Occasional dry cough but denies any chest pain, shortness of breath, fatigue or weakness.  Feels almost asymptomatic and as very can go back to work.  Discussed about findings, persistent leukocytosis but otherwise stabilizing.  Agreeable to go home with oral antibiotics and close follow-up.  Discussed that patient may need bronchoscopy and biopsy if repeat scans with persistent findings.   Discharge Exam: Vitals:   11/10/23 2214 11/11/23 0621  BP: 118/84 134/82  Pulse: 88 88  Resp:  18  Temp: 98.6 F (37 C) 98.8 F (37.1 C)   SpO2: 96% 98%   Vitals:   11/10/23 1100 11/10/23 1454 11/10/23 2214 11/11/23 0621  BP: 108/72 (!) 143/66 118/84 134/82  Pulse: 87 86 88 88  Resp: 16 16  18   Temp: 98.2 F (36.8 C) 99.2 F (37.3 C) 98.6 F (37 C) 98.8 F (37.1 C)  TempSrc: Oral Oral Oral Oral  SpO2: 96% 96% 96% 98%  Weight:      Height:        General: Pt is alert, awake, not in acute distress Cardiovascular: RRR, S1/S2 +, no rubs, no gallops Respiratory: CTA bilaterally, no wheezing, no rhonchi, no added sounds.  On room air. Abdominal: Soft, NT, ND, bowel sounds + Extremities: no edema, no cyanosis    The results of significant diagnostics from this hospitalization (including imaging, microbiology, ancillary and laboratory) are listed below for reference.     Microbiology: Recent Results (from the past 240 hours)  Resp panel by RT-PCR (RSV, Flu A&B, Covid) Anterior Nasal Swab     Status: None   Collection Time: 11/08/23  9:23 AM   Specimen: Anterior Nasal Swab  Result Value  Ref Range Status   SARS Coronavirus 2 by RT PCR NEGATIVE NEGATIVE Final    Comment: (NOTE) SARS-CoV-2 target nucleic acids are NOT DETECTED.  The SARS-CoV-2 RNA is generally detectable in upper respiratory specimens during the acute phase of infection. The lowest concentration of SARS-CoV-2 viral copies this assay can detect is 138 copies/mL. A negative result does not preclude SARS-Cov-2 infection and should not be used as the sole basis for treatment or other patient management decisions. A negative result may occur with  improper specimen collection/handling, submission of specimen other than nasopharyngeal swab, presence of viral mutation(s) within the areas targeted by this assay, and inadequate number of viral copies(<138 copies/mL). A negative result must be combined with clinical observations, patient history, and epidemiological information. The expected result is Negative.  Fact Sheet for Patients:   BloggerCourse.com  Fact Sheet for Healthcare Providers:  SeriousBroker.it  This test is no t yet approved or cleared by the [place]  FDA and  has been authorized for detection and/or diagnosis of SARS-CoV-2 by FDA under an Emergency Use Authorization (EUA). This EUA will remain  in effect (meaning this test can be used) for the duration of the COVID-19 declaration under Section 564(b)(1) of the Act, 21 U.S.C.section 360bbb-3(b)(1), unless the authorization is terminated  or revoked sooner.       Influenza A by PCR NEGATIVE NEGATIVE Final   Influenza B by PCR NEGATIVE NEGATIVE Final    Comment: (NOTE) The Xpert Xpress SARS-CoV-2/FLU/RSV plus assay is intended as an aid in the diagnosis of influenza from Nasopharyngeal swab specimens and should not be used as a sole basis for treatment. Nasal washings and aspirates are unacceptable for Xpert Xpress SARS-CoV-2/FLU/RSV testing.  Fact Sheet for Patients: BloggerCourse.com  Fact Sheet for Healthcare Providers: SeriousBroker.it  This test is not yet approved or cleared by the [place]  FDA and has been authorized for detection and/or diagnosis of SARS-CoV-2 by FDA under an Emergency Use Authorization (EUA). This EUA will remain in effect (meaning this test can be used) for the duration of the COVID-19 declaration under Section 564(b)(1) of the Act, 21 U.S.C. section 360bbb-3(b)(1), unless the authorization is terminated or revoked.     Resp Syncytial Virus by PCR NEGATIVE NEGATIVE Final    Comment: (NOTE) Fact Sheet for Patients: BloggerCourse.com  Fact Sheet for Healthcare Providers: SeriousBroker.it  This test is not yet approved or cleared by the [place]  FDA and has been authorized for detection and/or diagnosis of SARS-CoV-2 by FDA under an Emergency Use  Authorization (EUA). This EUA will remain in effect (meaning this test can be used) for the duration of the COVID-19 declaration under Section 564(b)(1) of the Act, 21 U.S.C. section 360bbb-3(b)(1), unless the authorization is terminated or revoked.  Performed at [facility], [address] [place], [place] [address]   MRSA Next Gen by PCR, Nasal     Status: None   Collection Time: 11/08/23  4:52 PM   Specimen: Nasal Mucosa; Nasal Swab  Result Value Ref Range Status   MRSA by PCR Next Gen NOT DETECTED NOT DETECTED Final    Comment: (NOTE) The GeneXpert MRSA Assay (FDA approved for NASAL specimens only), is one component of a comprehensive MRSA colonization surveillance program. It is not intended to diagnose MRSA infection nor to guide or monitor treatment for MRSA infections. Test performance is not FDA approved in patients less than 14 years old. Performed at [facility], [address], [place], [place] [address]  Culture, blood (Routine X 2) w Reflex to ID Panel     Status: None (Preliminary result)   Collection Time: 11/08/23  5:00 PM   Specimen: BLOOD LEFT ARM  Result Value Ref Range Status   Specimen Description   Final    BLOOD LEFT ARM Performed at [facility], [address] [place], [place] [address]    Special Requests   Final    BOTTLES DRAWN AEROBIC AND ANAEROBIC Blood Culture results may not be optimal due to an inadequate volume of blood received in culture bottles Performed at [facility], [address], [place], [place] [address]    Culture   Final    NO GROWTH 2 DAYS Performed at [facility], [address], [place], [place] [address]    Report Status PENDING  Incomplete     Labs: BNP (last 3 results) No results for input(s): BNP in the last 8760 hours. Basic Metabolic Panel: Recent Labs  Lab 11/08/23 0923 11/09/23 0421 11/11/23 0444  NA 139 135 139  K 3.9 3.8 4.0   CL 100 100 102  CO2 26 23 23   GLUCOSE 124* 122* 106*  BUN 15 12 12   CREATININE 1.26* 1.13 1.17  CALCIUM  10.0 9.0 9.4  MG  --  1.9 2.2  PHOS  --  3.5  --    Liver Function Tests: Recent Labs  Lab 11/08/23 0923 11/11/23 0444  AST 20 28  ALT 23 38  ALKPHOS 84 74  BILITOT 0.5 0.5  PROT 7.5 7.3  ALBUMIN 3.8 3.5   No results for input(s): LIPASE, AMYLASE in the last 168 hours. No results for input(s): AMMONIA in the last 168 hours. CBC: Recent Labs  Lab 11/08/23 0923 11/09/23 0421 11/10/23 0508 11/11/23 0444  WBC 20.4* 20.6* 18.6* 20.5*  NEUTROABS 16.4*  --  13.0* 13.7*  HGB 13.2 11.5* 10.9* 12.6*  HCT 40.3 35.1* 34.9* 40.7  MCV 86.7 86.9 87.7 87.9  PLT 303 280 280 321   Cardiac Enzymes: No results for input(s): CKTOTAL, CKMB, CKMBINDEX, TROPONINI in the last 168 hours. BNP: Invalid input(s): POCBNP CBG: No results for input(s): GLUCAP in the last 168 hours. D-Dimer No results for input(s): DDIMER in the last 72 hours. Hgb A1c No results for input(s): HGBA1C in the last 72 hours. Lipid Profile No results for input(s): CHOL, HDL, LDLCALC, TRIG, CHOLHDL, LDLDIRECT in the last 72 hours. Thyroid function studies No results for input(s): TSH, T4TOTAL, T3FREE, THYROIDAB in the last 72 hours.  Invalid input(s): FREET3 Anemia work up No results for input(s): VITAMINB12, FOLATE, FERRITIN, TIBC, IRON, RETICCTPCT in the last 72 hours. Urinalysis No results found for: COLORURINE, APPEARANCEUR, LABSPEC, PHURINE, GLUCOSEU, HGBUR, BILIRUBINUR, KETONESUR, PROTEINUR, UROBILINOGEN, NITRITE, LEUKOCYTESUR Sepsis Labs Recent Labs  Lab 11/08/23 [phone] 11/09/23 0421 11/10/23 0508 11/11/23 0444  WBC 20.4* 20.6* 18.6* 20.5*   Microbiology Recent Results (from the past 240 hours)  Resp panel by RT-PCR (RSV, Flu A&B, Covid) Anterior Nasal Swab     Status: None   Collection Time: 11/08/23  9:23 AM    Specimen: Anterior Nasal Swab  Result Value Ref Range Status   SARS Coronavirus 2 by RT PCR NEGATIVE NEGATIVE Final    Comment: (NOTE) SARS-CoV-2 target nucleic acids are NOT DETECTED.  The SARS-CoV-2 RNA is generally detectable in upper respiratory specimens during the acute phase of infection. The lowest concentration of SARS-CoV-2 viral copies this assay can detect is 138 copies/mL. A negative result does  not preclude SARS-Cov-2 infection and should not be used as the sole basis for treatment or other patient management decisions. A negative result may occur with  improper specimen collection/handling, submission of specimen other than nasopharyngeal swab, presence of viral mutation(s) within the areas targeted by this assay, and inadequate number of viral copies(<138 copies/mL). A negative result must be combined with clinical observations, patient history, and epidemiological information. The expected result is Negative.  Fact Sheet for Patients:  BloggerCourse.com  Fact Sheet for Healthcare Providers:  SeriousBroker.it  This test is no t yet approved or cleared by the [place]  FDA and  has been authorized for detection and/or diagnosis of SARS-CoV-2 by FDA under an Emergency Use Authorization (EUA). This EUA will remain  in effect (meaning this test can be used) for the duration of the COVID-19 declaration under Section 564(b)(1) of the Act, 21 U.S.C.section 360bbb-3(b)(1), unless the authorization is terminated  or revoked sooner.       Influenza A by PCR NEGATIVE NEGATIVE Final   Influenza B by PCR NEGATIVE NEGATIVE Final    Comment: (NOTE) The Xpert Xpress SARS-CoV-2/FLU/RSV plus assay is intended as an aid in the diagnosis of influenza from Nasopharyngeal swab specimens and should not be used as a sole basis for treatment. Nasal washings and aspirates are unacceptable for Xpert Xpress  SARS-CoV-2/FLU/RSV testing.  Fact Sheet for Patients: BloggerCourse.com  Fact Sheet for Healthcare Providers: SeriousBroker.it  This test is not yet approved or cleared by the [place]  FDA and has been authorized for detection and/or diagnosis of SARS-CoV-2 by FDA under an Emergency Use Authorization (EUA). This EUA will remain in effect (meaning this test can be used) for the duration of the COVID-19 declaration under Section 564(b)(1) of the Act, 21 U.S.C. section 360bbb-3(b)(1), unless the authorization is terminated or revoked.     Resp Syncytial Virus by PCR NEGATIVE NEGATIVE Final    Comment: (NOTE) Fact Sheet for Patients: BloggerCourse.com  Fact Sheet for Healthcare Providers: SeriousBroker.it  This test is not yet approved or cleared by the [place]  FDA and has been authorized for detection and/or diagnosis of SARS-CoV-2 by FDA under an Emergency Use Authorization (EUA). This EUA will remain in effect (meaning this test can be used) for the duration of the COVID-19 declaration under Section 564(b)(1) of the Act, 21 U.S.C. section 360bbb-3(b)(1), unless the authorization is terminated or revoked.  Performed at [facility], [address], [place], [place] [address]   MRSA Next Gen by PCR, Nasal     Status: None   Collection Time: 11/08/23  4:52 PM   Specimen: Nasal Mucosa; Nasal Swab  Result Value Ref Range Status   MRSA by PCR Next Gen NOT DETECTED NOT DETECTED Final    Comment: (NOTE) The GeneXpert MRSA Assay (FDA approved for NASAL specimens only), is one component of a comprehensive MRSA colonization surveillance program. It is not intended to diagnose MRSA infection nor to guide or monitor treatment for MRSA infections. Test performance is not FDA approved in patients less than 36 years old. Performed at [facility], [address], [place], [place] [address]   Culture, blood (Routine X 2) w Reflex to ID Panel     Status: None (Preliminary result)   Collection Time: 11/08/23  5:00 PM   Specimen: BLOOD LEFT ARM  Result Value Ref Range Status   Specimen Description   Final    BLOOD LEFT ARM Performed at [facility], [address], [place], [place] [address]    Special Requests   Final    BOTTLES DRAWN AEROBIC AND ANAEROBIC Blood Culture results may not be optimal due to an inadequate volume of blood received in culture bottles Performed at [facility], [address] [place], [place] [address]    Culture   Final    NO GROWTH 2 DAYS Performed at [facility], [address] [place], [place] [address]    Report Status PENDING  Incomplete     Time coordinating discharge: 35 minutes  SIGNED:   Renato Applebaum, MD  [facility] 11/11/2023, 1:17 PM

## 2023-11-11 NOTE — Progress Notes (Signed)
 Discharge instructions given to patient questions asked and answered.Discharge medications delivered to patient at bedside in a secure bag.

## 2023-11-11 NOTE — Plan of Care (Signed)

## 2023-11-13 LAB — CULTURE, BLOOD (ROUTINE X 2)
Culture: NO GROWTH
Special Requests: ADEQUATE

## 2023-11-14 LAB — CULTURE, BLOOD (ROUTINE X 2): Culture: NO GROWTH

## 2023-11-15 ENCOUNTER — Emergency Department (HOSPITAL_BASED_OUTPATIENT_CLINIC_OR_DEPARTMENT_OTHER)
Admission: EM | Admit: 2023-11-15 | Discharge: 2023-11-15 | Disposition: A | Discharge status: Home / Self Care | Attending: Emergency Medicine | Admitting: Emergency Medicine

## 2023-11-15 ENCOUNTER — Other Ambulatory Visit: Payer: Self-pay

## 2023-11-15 ENCOUNTER — Emergency Department (HOSPITAL_BASED_OUTPATIENT_CLINIC_OR_DEPARTMENT_OTHER)

## 2023-11-15 DIAGNOSIS — K8020 Calculus of gallbladder without cholecystitis without obstruction: Secondary | ICD-10-CM | POA: Diagnosis not present

## 2023-11-15 DIAGNOSIS — R109 Unspecified abdominal pain: Secondary | ICD-10-CM | POA: Diagnosis present

## 2023-11-15 DIAGNOSIS — R1013 Epigastric pain: Secondary | ICD-10-CM

## 2023-11-15 LAB — URINALYSIS, ROUTINE W REFLEX MICROSCOPIC
Bacteria, UA: NONE SEEN
Bilirubin Urine: NEGATIVE
Glucose, UA: NEGATIVE mg/dL
Ketones, ur: NEGATIVE mg/dL
Leukocytes,Ua: NEGATIVE
Nitrite: NEGATIVE
Protein, ur: NEGATIVE mg/dL
Specific Gravity, Urine: 1.016 (ref 1.005–1.030)
pH: 5.5 (ref 5.0–8.0)

## 2023-11-15 LAB — COMPREHENSIVE METABOLIC PANEL WITH GFR
ALT: 46 U/L — ABNORMAL HIGH (ref 0–44)
AST: 30 U/L (ref 15–41)
Albumin: 3.6 g/dL (ref 3.5–5.0)
Alkaline Phosphatase: 71 U/L (ref 38–126)
Anion gap: 11 (ref 5–15)
BUN: 13 mg/dL (ref 8–23)
CO2: 24 mmol/L (ref 22–32)
Calcium: 9.6 mg/dL (ref 8.9–10.3)
Chloride: 103 mmol/L (ref 98–111)
Creatinine, Ser: 0.99 mg/dL (ref 0.61–1.24)
GFR, Estimated: >60 mL/min (ref >60)
Glucose, Bld: 106 mg/dL — ABNORMAL HIGH (ref 70–99)
Potassium: 3.8 mmol/L (ref 3.5–5.1)
Sodium: 138 mmol/L (ref 135–145)
Total Bilirubin: 0.4 mg/dL (ref 0.0–1.2)
Total Protein: 7.9 g/dL (ref 6.5–8.1)

## 2023-11-15 LAB — CBC
HCT: 36.4 % — ABNORMAL LOW (ref 39.0–52.0)
Hemoglobin: 12.1 g/dL — ABNORMAL LOW (ref 13.0–17.0)
MCH: 28.3 pg (ref 26.0–34.0)
MCHC: 33.2 g/dL (ref 30.0–36.0)
MCV: 85 fL (ref 80.0–100.0)
Platelets: 378 K/uL (ref 150–400)
RBC: 4.28 MIL/uL (ref 4.22–5.81)
RDW: 12.3 % (ref 11.5–15.5)
WBC: 16.5 K/uL — ABNORMAL HIGH (ref 4.0–10.5)
nRBC: 0 % (ref 0.0–0.2)

## 2023-11-15 LAB — LIPASE, BLOOD: Lipase: 25 U/L (ref 11–51)

## 2023-11-15 MED ORDER — ALUM & MAG HYDROXIDE-SIMETH 200-200-20 MG/5ML PO SUSP
15.0000 mL | Freq: Once | ORAL | Status: AC
  Administered 2023-11-15: 15 mL via ORAL
  Filled 2023-11-15: qty 30

## 2023-11-15 NOTE — ED Notes (Signed)
 Reviewed AVS/discharge instruction with patient. Time allotted for and all questions answered. Patient is agreeable for d/c and escorted to ed exit by staff.

## 2023-11-15 NOTE — ED Notes (Signed)
 Snack and drinks provided

## 2023-11-15 NOTE — ED Provider Notes (Signed)
 Antonio Cook Provider Note   CSN: [id number] Arrival date & time: 11/15/23  1437     Patient presents with: Abdominal Pain and Nausea   RHODERICK Cook is a 68 y.o. male.    Abdominal Pain Patient abdominal pain.  Upper abdomen.  Worse after taking medicine.  Has recent admission for cavitary pneumonia and is on Augmentin.  Has had some issues with abdominal pain if he takes it without food.  However he states he took it with food today and last night.  No dysuria.  No blood in the stool.  Has had frequent bowel movements today however.  States has had 5 bowel movements and they were small but formed.  No diarrhea.  Reviewing notes has had previous cholelithiasis.  States that eating today has pain stable or improved.  Not worsening.     Prior to Admission medications   Medication Sig Start Date End Date Taking? Authorizing Provider  ALPRAZolam  (XANAX ) 0.5 MG tablet Take 1 tablet (0.5 mg total) by mouth daily as needed for anxiety. 07/16/23   Sater, Charlie LABOR, MD  amoxicillin-clavulanate (AUGMENTIN) 875-125 MG tablet Take 1 tablet by mouth 2 (two) times daily for 12 days. 11/11/23 11/23/23  Ghimire, Kuber, MD  Cholecalciferol (VITAMIN D3) 1000 units CAPS Take 1,000 Units by mouth at bedtime.    [provider]  etodolac  (LODINE ) 200 MG capsule Take 1 capsule (200 mg total) by mouth every 8 (eight) hours with food as needed. Patient taking differently: Take 200 mg by mouth every 8 (eight) hours as needed (for pain). 05/06/23     finasteride  (PROSCAR ) 5 MG tablet Take 1 tablet (5 mg total) by mouth daily. 07/09/23     fluticasone  (FLONASE ) 50 MCG/ACT nasal spray Place 2 sprays into both nostrils daily. Patient taking differently: Place 2 sprays into both nostrils at bedtime. 11/05/23     HYDROcodone  bit-homatropine (HYCODAN) 5-1.5 MG/5ML syrup Take 5 mLs by mouth every 6 (six) hours as needed for cough 11/05/23     losartan  (COZAAR ) 100  MG tablet Take 1 tablet (100 mg total) by mouth daily. 05/06/23     Magnesium 250 MG TABS Take 500 mg by mouth at bedtime.    [provider]  mirabegron  ER (MYRBETRIQ ) 50 MG TB24 tablet Take 1 tablet (50 mg total) by mouth daily. Patient taking differently: Take 50 mg by mouth at bedtime. 02/16/23     modafinil  (PROVIGIL ) 200 MG tablet TAKE 1 TABLET BY MOUTH DAILY Patient taking differently: Take 200 mg by mouth daily as needed (for drowsiness). 07/02/23   Onita Duos, MD  ondansetron  (ZOFRAN ) 4 MG tablet Take 1 tablet (4 mg total) by mouth every 8 (eight) hours as needed for nausea or vomiting. 10/11/14   Jenel Carlin POUR, MD  rosuvastatin  (CRESTOR ) 10 MG tablet Take 1 tablet (10 mg total) by mouth daily. Patient taking differently: Take 10 mg by mouth every other day. 05/06/23     saccharomyces boulardii (FLORASTOR) 250 MG capsule Take 1 capsule (250 mg total) by mouth 2 (two) times daily. 11/11/23   Raenelle Coria, MD  tamsulosin  (FLOMAX ) 0.4 MG CAPS capsule Take 1 capsule (0.4 mg total) by mouth 2 (two) times daily. Patient taking differently: Take 0.4 mg by mouth at bedtime. 04/29/23     Vibegron  (GEMTESA ) 75 MG TABS Take 1 tablet (75 mg total) by mouth daily. 05/01/23     zolpidem  (AMBIEN ) 10 MG tablet Take 1 tablet (10 mg)  by mouth at bedtime. 08/24/23   Sater, Charlie LABOR, MD    Allergies: Solifenacin, Gadolinium derivatives, and Hydrochlorothiazide    Review of Systems  Gastrointestinal:  Positive for abdominal pain.    Updated Vital Signs BP 136/80 (BP Location: Right Arm)   Pulse 90   Temp 99 F (37.2 C) (Oral)   Resp 16   Ht 5' 9 (1.753 m)   Wt 74.8 kg   SpO2 100%   BMI 24.37 kg/m   Physical Exam Vitals and nursing note reviewed.  Cardiovascular:     Rate and Rhythm: Normal rate.  Abdominal:     Hernia: No hernia is present.     Comments: Epigastric to right upper quadrant tenderness no rebound or guarding.  No hernia palpated.  Skin:    General: Skin is warm.      Capillary Refill: Capillary refill takes less than 2 seconds.  Neurological:     Mental Status: He is alert.     (all labs ordered are listed, but only abnormal results are displayed) Labs Reviewed  COMPREHENSIVE METABOLIC PANEL WITH GFR - Abnormal; Notable for the following components:      Result Value   Glucose, Bld 106 (*)    ALT 46 (*)    All other components within normal limits  CBC - Abnormal; Notable for the following components:   WBC 16.5 (*)    Hemoglobin 12.1 (*)    HCT 36.4 (*)    All other components within normal limits  URINALYSIS, ROUTINE W REFLEX MICROSCOPIC - Abnormal; Notable for the following components:   Hgb urine dipstick TRACE (*)    All other components within normal limits  LIPASE, BLOOD    EKG: None  Radiology: US  Abdomen Limited RUQ (LIVER/GB) Result Date: 11/15/2023 CLINICAL DATA:  [id number] RUQ abdominal pain [id number] EXAM: ULTRASOUND ABDOMEN LIMITED RIGHT UPPER QUADRANT COMPARISON:  12/26/2021 FINDINGS: Gallbladder: 2.2 cm gallstone in the gallbladder neck. A couple of punctate gallstones are also noted in the gallbladder body. No wall thickening or pericholecystic fluid. Positive sonographic Murphy's sign noted by sonographer. Common bile duct: Diameter: 3 mm Liver: Normal echogenicity. No focal lesion identified. No intrahepatic biliary ductal dilation. Portal vein is patent on color Doppler imaging with normal direction of blood flow towards the liver. Right Kidney: Partially visualized. No mass. No hydronephrosis or nephrolithiasis. Other: None. IMPRESSION: Multiple gallstones with a positive sonographic Murphy sign. In the absence of wall thickening and pericholecystic fluid, the study is equivocal for acute cholecystitis. If concern persists for acute cholecystitis, a nuclear medicine hepatobiliary scan would be recommended. Electronically Signed   By: Rogelia Myers M.D.   On: 11/15/2023 17:17     Procedures   Medications Ordered in the ED   alum & mag hydroxide-simeth (MAALOX/MYLANTA) 200-200-20 MG/5ML suspension 15 mL (15 mLs Oral Given 11/15/23 1745)                                    Medical Decision Making Amount and/or Complexity of Data Reviewed Labs: ordered. Radiology: ordered.  Risk OTC drugs.   Patient abdominal pain.  Upper abdomen.  Differential diagnoses longed includes causes such as gastritis but also cause such as biliary colic and obstruction.  Has not been vomiting.  Does have recent antibiotics.  Blood work does show decreasing white count but still elevated.  LFTs overall reassuring.  Ultrasound done and does show cholelithiasis with some  pain at the site but no wall thickening or pericholecystic fluid.  Will give oral trial here.  Feeling better after oral trial.  Potentially does have biliary colic but I feel less likely it is the cause since pain did not worsen with eating.  Will have follow-up with surgery however.  Maalox had worked here and will take some at home.  For now we will continue antibiotics.  Follow-up with PCP.      Final diagnoses:  Epigastric pain  Calculus of gallbladder without cholecystitis without obstruction    ED Discharge Orders     None          Patsey Lot, MD 11/15/23 2317

## 2023-11-15 NOTE — ED Triage Notes (Signed)
 Pt POV reporting mid upper abd pain since this AM, recently discharged 10/1 r/t pneumonia, taking abx. Last BM this morning.

## 2023-11-15 NOTE — ED Notes (Signed)
 US  at bedside.

## 2023-11-23 ENCOUNTER — Other Ambulatory Visit (HOSPITAL_COMMUNITY): Payer: Self-pay

## 2023-11-24 ENCOUNTER — Other Ambulatory Visit: Payer: Self-pay | Admitting: Internal Medicine

## 2023-11-24 DIAGNOSIS — J189 Pneumonia, unspecified organism: Secondary | ICD-10-CM

## 2023-11-24 DIAGNOSIS — J984 Other disorders of lung: Secondary | ICD-10-CM

## 2023-11-26 ENCOUNTER — Other Ambulatory Visit (HOSPITAL_COMMUNITY): Payer: Self-pay

## 2023-11-27 ENCOUNTER — Other Ambulatory Visit: Payer: Self-pay

## 2023-11-27 ENCOUNTER — Other Ambulatory Visit (HOSPITAL_COMMUNITY): Payer: Self-pay

## 2023-11-30 ENCOUNTER — Other Ambulatory Visit (HOSPITAL_COMMUNITY): Payer: Self-pay

## 2023-11-30 ENCOUNTER — Ambulatory Visit
Admission: RE | Admit: 2023-11-30 | Discharge: 2023-11-30 | Disposition: A | Discharge status: Home / Self Care | Source: Ambulatory Visit | Attending: Internal Medicine | Admitting: Internal Medicine

## 2023-11-30 DIAGNOSIS — J984 Other disorders of lung: Secondary | ICD-10-CM

## 2023-11-30 DIAGNOSIS — J189 Pneumonia, unspecified organism: Secondary | ICD-10-CM

## 2023-11-30 MED ORDER — IOPAMIDOL (ISOVUE-370) INJECTION 76%
70.0000 mL | Freq: Once | INTRAVENOUS | Status: AC | PRN
  Administered 2023-11-30: 70 mL via INTRAVENOUS

## 2023-12-09 ENCOUNTER — Other Ambulatory Visit (HOSPITAL_COMMUNITY): Payer: Self-pay

## 2023-12-09 ENCOUNTER — Ambulatory Visit (INDEPENDENT_AMBULATORY_CARE_PROVIDER_SITE_OTHER)

## 2023-12-09 VITALS — BP 118/76 | HR 81 | Temp 98.5°F | Ht 69.0 in | Wt 169.4 lb

## 2023-12-09 DIAGNOSIS — J181 Lobar pneumonia, unspecified organism: Secondary | ICD-10-CM

## 2023-12-09 DIAGNOSIS — Z23 Encounter for immunization: Secondary | ICD-10-CM

## 2023-12-09 DIAGNOSIS — J984 Other disorders of lung: Secondary | ICD-10-CM | POA: Diagnosis not present

## 2023-12-09 DIAGNOSIS — J189 Pneumonia, unspecified organism: Secondary | ICD-10-CM

## 2023-12-09 MED ORDER — AMOXICILLIN-POT CLAVULANATE 875-125 MG PO TABS
1.0000 | ORAL_TABLET | Freq: Two times a day (BID) | ORAL | 0 refills | Status: AC
  Filled 2023-12-09: qty 28, 14d supply, fill #0

## 2023-12-09 NOTE — Progress Notes (Signed)
 Established Patient Pulmonology Office Visit   Subjective:  Patient ID: Antonio Cook, male    DOB: [birth year]  MRN: [id number]  CC: No chief complaint on file.   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 His initial CT scan showed mass like consolidation of the LUL with cavitary mass. Repeated CT scan in 3 weeks showed marked reduction in LUL consolidation and cavitary process consistent with resolving pulm infection.  He does not have a clear cause of this pneumonia. He said he had a choking sensation  prior this episode. No dental procedures, sick contacts, history of tuberculosis, or traveling outside of the [place] .  Interval history: Patient denied any current symptoms. No dysnea, no fever, no sputum production.  ROS as above.   Current Outpatient Medications:    ALPRAZolam  (XANAX ) 0.5 MG tablet, Take 1 tablet (0.5 mg total) by mouth daily as needed for anxiety., Disp: 25 tablet, Rfl: 1   Cholecalciferol (VITAMIN D3) 1000 units CAPS, Take 1,000 Units by mouth at bedtime., Disp: , Rfl:    etodolac  (LODINE ) 200 MG capsule, Take 1 capsule (200 mg total) by mouth every 8 (eight) hours with food as needed. (Patient taking differently: Take 200 mg by mouth every 8 (eight) hours as needed (for pain).), Disp: 30 capsule, Rfl: 1   finasteride  (PROSCAR ) 5 MG tablet, Take 1 tablet (5 mg total) by mouth daily., Disp: 90 tablet, Rfl: 3   fluticasone  (FLONASE ) 50 MCG/ACT nasal spray, Place 2 sprays into both nostrils daily. (Patient taking differently: Place 2 sprays into both nostrils at bedtime.), Disp: 16 g, Rfl: 0   HYDROcodone  bit-homatropine (HYCODAN) 5-1.5 MG/5ML syrup, Take 5 mLs by mouth every 6 (six) hours as needed for cough, Disp: 100 mL, Rfl: 0   losartan  (COZAAR ) 100 MG tablet, Take 1 tablet (100 mg total) by mouth daily., Disp: 90 tablet,  Rfl: 3   Magnesium 250 MG TABS, Take 500 mg by mouth at bedtime., Disp: , Rfl:    mirabegron  ER (MYRBETRIQ ) 50 MG TB24 tablet, Take 1 tablet (50 mg total) by mouth daily. (Patient taking differently: Take 50 mg by mouth at bedtime.), Disp: 30 tablet, Rfl: 3   modafinil  (PROVIGIL ) 200 MG tablet, TAKE 1 TABLET BY MOUTH DAILY (Patient taking differently: Take 200 mg by mouth daily as needed (for drowsiness).), Disp: 90 tablet, Rfl: 1   ondansetron  (ZOFRAN ) 4 MG tablet, Take 1 tablet (4 mg total) by mouth every 8 (eight) hours as needed for nausea or vomiting., Disp: 5 tablet, Rfl: 0   rosuvastatin  (CRESTOR ) 10 MG tablet, Take 1 tablet (10 mg total) by mouth daily. (Patient taking differently: Take 10 mg by mouth every other day.), Disp: 90 tablet, Rfl: 3   saccharomyces boulardii (FLORASTOR) 250 MG capsule, Take 1 capsule (250 mg total) by mouth 2 (two) times daily., Disp: 60 capsule, Rfl: 0   tamsulosin  (FLOMAX ) 0.4 MG CAPS capsule, Take 1 capsule (0.4 mg total) by mouth 2 (two) times daily. (Patient taking differently: Take 0.4 mg by mouth at bedtime.), Disp: 180 capsule, Rfl: 3   Vibegron  (GEMTESA ) 75 MG TABS, Take 1 tablet (75 mg total) by mouth daily., Disp: 30 tablet, Rfl: 3   zolpidem  (AMBIEN ) 10 MG tablet, Take 1 tablet (10 mg) by mouth at bedtime., Disp: 90 tablet, Rfl: 1      Objective:  There  were no vitals taken for this visit. Wt Readings from Last 3 Encounters:  12/09/23 169 lb 6.4 oz (76.8 kg)  11/15/23 165 lb (74.8 kg)  11/08/23 170 lb (77.1 kg)   SpO2 Readings from Last 3 Encounters:  12/09/23 98%  11/15/23 100%  11/11/23 98%    Physical Exam Constitutional:      Appearance: Normal appearance.  HENT:     Head: Normocephalic.  Eyes:     Pupils: Pupils are equal, round, and reactive to light.  Cardiovascular:     Rate and Rhythm: Normal rate and regular rhythm.  Pulmonary:     Effort: Pulmonary effort is normal.     Breath sounds: Normal breath sounds.   Musculoskeletal:        General: Normal range of motion.  Neurological:     General: No focal deficit present.     Mental Status: He is alert and oriented to person, place, and time.   Social HX: No history of alcohol abuse, no history of smoking  Works as therapist, sports.  Diagnostic Review:   CT chest 11/08/23 1. Adjacent LEFT upper lobe consolidative masses with cavitation are most suggestive of lobar pneumonia. Recommend short-term CT follow-up following appropriate antibiotic therapy to demonstrate improvement/resolution and exclude underlying malignancy. 2. Mild ipsilateral mediastinal adenopathy is favored reactive.  11/30/2023 CT chest:   1. Marked reduction in left upper lobe consolidation and cavitary process consistent with resolving pulmonary infection. As consolidation and cavitation persist, recommend follow-up CT in 2 to 3 months.  Assessment & Plan:   Assessment & Plan Cavitary pneumonia Lobar pneumonia, unspecified organism Dr. Tasia was admitted on 11/08/23 due to necrotizing pneumonia.  He received 2 weeks of Augmentin.  Repeated CT scan shows marked reduction in the left upper lobe consolidation and cavitary process consistent with resolving pneumonia. Prior this event he had an aspiration event that could be the culprit, however the location LUL is not very common. Given that he only completed 2 weeks of antibiotics, I will normally treat 4-6 weeks antibiotics for complete resolution in the setting of necrotizing pneumonia. He is currently asymptomatic.  Plan: Complete 2 more weeks of Augmentin to finish the antibiotic course. Repeat CT scan first week of December - 2 months from the initial presentation.   Orders:   CT Chest Wo Contrast; Future  Flu vaccine need  Orders:   Flu vaccine HIGH DOSE PF(Fluzone Trivalent)   Return in about 6 weeks (around 01/18/2024). After CT scan results.   Marny Patch, MD Pulmonary and Critical Care Medicine [facility]

## 2023-12-09 NOTE — Patient Instructions (Addendum)
 Dear Mr. Beynon,  Due to necrotizing pneumonia, I will recommend to finish 4 weeks total of antibiotics. -Augmentin twice a day for 2 weeks to complete 4 weeks of antibiotics -CT scan in one month,  Follow up after CT scan around second week of December.

## 2023-12-29 ENCOUNTER — Other Ambulatory Visit: Payer: Self-pay | Admitting: *Deleted

## 2023-12-29 MED ORDER — MODAFINIL 200 MG PO TABS: 200.0000 mg | ORAL_TABLET | Freq: Every day | ORAL | 1 refills | Status: AC

## 2023-12-29 NOTE — Telephone Encounter (Signed)
 Last seen on 07/16/23 Follow up scheduled 02/24/24   modafinil  (PROVIGIL ) 200 MG tablet 07/02/2023 -- 90 tablet 1/1 [facility] 09...   TAKE 1 TABLET BY MOUTH DAILY     Rx pending to be signed

## 2024-01-11 ENCOUNTER — Other Ambulatory Visit

## 2024-01-13 ENCOUNTER — Ambulatory Visit: Admission: RE | Admit: 2024-01-13 | Discharge: 2024-01-13 | Disposition: A | Source: Ambulatory Visit

## 2024-01-13 DIAGNOSIS — J181 Lobar pneumonia, unspecified organism: Secondary | ICD-10-CM

## 2024-01-14 ENCOUNTER — Ambulatory Visit: Payer: Self-pay

## 2024-01-15 ENCOUNTER — Other Ambulatory Visit (HOSPITAL_COMMUNITY): Payer: Self-pay

## 2024-01-17 NOTE — Progress Notes (Unsigned)
 Established Patient Pulmonology Office Visit   Subjective:  Patient ID: Antonio Cook, male    DOB: August 12, 1955  MRN: [id number]  CC: No chief complaint on file.   HPI Dr. Hemmelgarn is a 68 yo without significant pulmonary past medical history, who was admitted on 11/08/23 due to left upper lobe necrotizing pneumonia treated with 2 weeks of Augmentin . His initial CT scan showed mass like consolidation of the LUL with cavitary mass. Repeated CT scan in 3 weeks showed marked reduction in LUL consolidation and cavitary process consistent with resolving pulm infection.  He does not have a clear cause of this pneumonia. He said he had a choking sensation  prior this episode. No dental procedures, sick contacts, history of tuberculosis, or traveling outside of the [place] .  Interval history: Patient denied any current symptoms. No dysnea, no fever, no sputum production.  ROS as above.   Current Outpatient Medications:    ALPRAZolam  (XANAX ) 0.5 MG tablet, Take 1 tablet (0.5 mg total) by mouth daily as needed for anxiety., Disp: 25 tablet, Rfl: 1   Cholecalciferol (VITAMIN D3) 1000 units CAPS, Take 1,000 Units by mouth at bedtime., Disp: , Rfl:    etodolac  (LODINE ) 200 MG capsule, Take 1 capsule (200 mg total) by mouth every 8 (eight) hours with food as needed., Disp: 30 capsule, Rfl: 1   finasteride  (PROSCAR ) 5 MG tablet, Take 1 tablet (5 mg total) by mouth daily., Disp: 90 tablet, Rfl: 3   fluticasone  (FLONASE ) 50 MCG/ACT nasal spray, Place 2 sprays into both nostrils daily. (Patient not taking: Reported on 12/09/2023), Disp: 16 g, Rfl: 0   HYDROcodone  bit-homatropine (HYCODAN) 5-1.5 MG/5ML syrup, Take 5 mLs by mouth every 6 (six) hours as needed for cough, Disp: 100 mL, Rfl: 0   losartan  (COZAAR ) 100 MG tablet, Take 1 tablet (100 mg total) by mouth daily., Disp: 90 tablet, Rfl: 3   Magnesium  250 MG TABS, Take 500 mg by mouth at bedtime., Disp: , Rfl:    mirabegron  ER (MYRBETRIQ ) 50 MG TB24 tablet,  Take 1 tablet (50 mg total) by mouth daily., Disp: 30 tablet, Rfl: 3   modafinil  (PROVIGIL ) 200 MG tablet, Take 1 tablet (200 mg total) by mouth daily., Disp: 90 tablet, Rfl: 1   ondansetron  (ZOFRAN ) 4 MG tablet, Take 1 tablet (4 mg total) by mouth every 8 (eight) hours as needed for nausea or vomiting., Disp: 5 tablet, Rfl: 0   rosuvastatin  (CRESTOR ) 10 MG tablet, Take 1 tablet (10 mg total) by mouth daily., Disp: 90 tablet, Rfl: 3   saccharomyces boulardii (FLORASTOR) 250 MG capsule, Take 1 capsule (250 mg total) by mouth 2 (two) times daily., Disp: 60 capsule, Rfl: 0   tamsulosin  (FLOMAX ) 0.4 MG CAPS capsule, Take 1 capsule (0.4 mg total) by mouth 2 (two) times daily., Disp: 180 capsule, Rfl: 3   Vibegron  (GEMTESA ) 75 MG TABS, Take 1 tablet (75 mg total) by mouth daily., Disp: 30 tablet, Rfl: 3   zolpidem  (AMBIEN ) 10 MG tablet, Take 1 tablet (10 mg) by mouth at bedtime., Disp: 90 tablet, Rfl: 1      Objective:  There were no vitals taken for this visit. Wt Readings from Last 3 Encounters:  12/09/23 169 lb 6.4 oz (76.8 kg)  11/15/23 165 lb (74.8 kg)  11/08/23 170 lb (77.1 kg)   SpO2 Readings from Last 3 Encounters:  12/09/23 98%  11/15/23 100%  11/11/23 98%    Physical Exam Constitutional:      Appearance:  Normal appearance.  HENT:     Head: Normocephalic.  Eyes:     Pupils: Pupils are equal, round, and reactive to light.  Cardiovascular:     Rate and Rhythm: Normal rate and regular rhythm.  Pulmonary:     Effort: Pulmonary effort is normal.     Breath sounds: Normal breath sounds.  Musculoskeletal:        General: Normal range of motion.  Neurological:     General: No focal deficit present.     Mental Status: He is alert and oriented to person, place, and time.   Social HX: No history of alcohol abuse, no history of smoking  Works as therapist, sports.  Diagnostic Review:   CT chest 11/08/23 1. Adjacent LEFT upper lobe consolidative masses with cavitation are most  suggestive of lobar pneumonia. Recommend short-term CT follow-up following appropriate antibiotic therapy to demonstrate improvement/resolution and exclude underlying malignancy. 2. Mild ipsilateral mediastinal adenopathy is favored reactive.  11/30/2023 CT chest:   1. Marked reduction in left upper lobe consolidation and cavitary process consistent with resolving pulmonary infection. As consolidation and cavitation persist, recommend follow-up CT in 2 to 3 months.  Assessment & Plan:   Assessment & Plan  Dr. Tasia was admitted on 11/08/23 due to necrotizing pneumonia.  He received 2 weeks of Augmentin .  Repeated CT scan shows marked reduction in the left upper lobe consolidation and cavitary process consistent with resolving pneumonia. Prior this event he had an aspiration event that could be the culprit, however the location LUL is not very common. He received a total of 4 weeks of Augmentin , with significant resolution in follow up CT scan.  He is currently asymptomatic.  Complete 2 more weeks of Augmentin  to finish the antibiotic course. Repeat CT scan first week of December - 2 months from the initial presentation.   No follow-ups on file. After CT scan results.   Marny Patch, MD Pulmonary and Critical Care Medicine [facility]

## 2024-01-18 ENCOUNTER — Ambulatory Visit

## 2024-01-18 ENCOUNTER — Other Ambulatory Visit (HOSPITAL_COMMUNITY): Payer: Self-pay

## 2024-01-18 VITALS — BP 129/79 | HR 73 | Temp 98.2°F | Ht 69.0 in | Wt 179.0 lb

## 2024-01-18 DIAGNOSIS — J850 Gangrene and necrosis of lung: Secondary | ICD-10-CM

## 2024-01-28 ENCOUNTER — Other Ambulatory Visit (HOSPITAL_COMMUNITY): Payer: Self-pay

## 2024-01-31 ENCOUNTER — Other Ambulatory Visit (HOSPITAL_COMMUNITY): Payer: Self-pay

## 2024-02-01 ENCOUNTER — Other Ambulatory Visit (HOSPITAL_COMMUNITY): Payer: Self-pay

## 2024-02-01 MED ORDER — LOSARTAN POTASSIUM 100 MG PO TABS
100.0000 mg | ORAL_TABLET | Freq: Every day | ORAL | 2 refills | Status: AC
  Filled 2024-02-01 (×3): qty 90, 90d supply, fill #0

## 2024-02-08 ENCOUNTER — Other Ambulatory Visit (HOSPITAL_COMMUNITY): Payer: Self-pay

## 2024-02-09 ENCOUNTER — Other Ambulatory Visit (HOSPITAL_COMMUNITY): Payer: Self-pay

## 2024-02-10 ENCOUNTER — Ambulatory Visit

## 2024-02-10 ENCOUNTER — Other Ambulatory Visit (HOSPITAL_COMMUNITY): Payer: Self-pay

## 2024-02-10 ENCOUNTER — Ambulatory Visit: Payer: Self-pay

## 2024-02-10 VITALS — BP 124/78 | HR 72 | Temp 98.0°F | Ht 69.0 in | Wt 168.8 lb

## 2024-02-10 DIAGNOSIS — J209 Acute bronchitis, unspecified: Secondary | ICD-10-CM | POA: Diagnosis not present

## 2024-02-10 MED ORDER — PREDNISONE 10 MG PO TABS
10.0000 mg | ORAL_TABLET | Freq: Every day | ORAL | 0 refills | Status: DC
  Filled 2024-02-10: qty 20, 20d supply, fill #0

## 2024-02-10 MED ORDER — DOXYCYCLINE HYCLATE 100 MG PO TABS
100.0000 mg | ORAL_TABLET | Freq: Two times a day (BID) | ORAL | 0 refills | Status: DC
  Filled 2024-02-10: qty 14, 7d supply, fill #0

## 2024-02-10 MED ORDER — DOXYCYCLINE HYCLATE 100 MG PO TABS
100.0000 mg | ORAL_TABLET | Freq: Two times a day (BID) | ORAL | 0 refills | Status: AC
  Filled 2024-02-10: qty 14, 7d supply, fill #0

## 2024-02-10 NOTE — Telephone Encounter (Signed)
 FYI Only or Action Required?: Action required by provider: request for appointment.  Patient was last seen in primary care on .  Called Nurse Triage reporting Cough.  Symptoms began several days ago.  Interventions attempted: Rest, hydration, or home remedies.  Symptoms are: unchanged. Cough with wheezing. Recent pneumonia.   Triage Disposition: See Physician Within 24 Hours  Patient/caregiver understands and will follow disposition?: Yes      Copied from CRM #[id number]. Topic: Clinical - Red Word Triage >> Feb 10, 2024 10:13 AM Antonio [name] wrote: Red Word that prompted transfer to Nurse Triage: pt reports head cold, cough and wheezing which he states he is not used to wheezing - pt just recovered from pneumonia [name] few days ago. Reason for Disposition  [1] Continuous (nonstop) coughing interferes with work or school AND [2] no improvement using cough treatment per Care Advice  Answer Assessment - Initial Assessment Questions 1. ONSET: When did the cough begin?      5 days ago 2. SEVERITY: How bad is the cough today?      moderate 3. SPUTUM: Describe the color of your sputum (e.g., none, dry cough; clear, white, yellow, green)     clear 4. HEMOPTYSIS: Are you coughing up any blood? If Yes, ask: How much? (e.g., flecks, streaks, tablespoons, etc.)     no 5. DIFFICULTY BREATHING: Are you having difficulty breathing? If Yes, ask: How bad is it? (e.g., mild, moderate, severe)      no 6. FEVER: Do you have [name] fever? If Yes, ask: What is your temperature, how was it measured, and when did it start?     no 7. CARDIAC HISTORY: Do you have any history of heart disease? (e.g., heart attack, congestive heart failure)      no 8. LUNG HISTORY: Do you have any history of lung disease?  (e.g., pulmonary embolus, asthma, emphysema)     yes 9. PE RISK FACTORS: Do you have [name] history of blood clots? (or: recent major surgery, recent prolonged travel, bedridden)     no 10.  OTHER SYMPTOMS: Do you have any other symptoms? (e.g., runny nose, wheezing, chest pain)       Runny nose, wheezing 11. PREGNANCY: Is there any chance you are pregnant? When was your last menstrual period?       N/[name] 12. TRAVEL: Have you traveled out of the country in the last month? (e.g., travel history, exposures)       no  Protocols used: Cough - Acute Productive-[name]-AH

## 2024-02-10 NOTE — Patient Instructions (Addendum)
 VISIT SUMMARY: Today, you were seen for respiratory symptoms and wheezing that have been present for about a week. You have been experiencing nasal congestion, runny eyes, and a sensation of fullness, with occasional post-nasal drip leading to a cough. You also reported a wheezing noise when lying on your left side, which has been waking you up at night.  YOUR PLAN: ACUTE BRONCHITIS: You have symptoms of acute bronchitis, which started with a head cold and congestion about seven days ago. -A viral swab ruled out flu and COVID-19. -A chest x-ray has been ordered. -antibiotics and steroids.    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 - consider follow-up CT chest in future    Contains text generated by [employer].

## 2024-02-10 NOTE — Progress Notes (Signed)
 "  New Patient Pulmonology Office Visit   Subjective:  Patient ID: Antonio [name], male    DOB: 1955-08-18  MRN: [id number]  Referred by: Avva, Ravisankar, MD  CC:  Chief Complaint  Patient presents with   Medical Management of Chronic Issues    Acute - pt states x2 months ago  had pneumonia was hospital, went out of town via flying, congestion w/ drainage, wheezing     HPI Antonio [name] is a 68 y.o. male with regularly follows with Dr Adrien.  He was admitted to the hospital for necrotizing pneumonia on September 20 09/2023.  Received 4 weeks of Augmentin .  Repeat CT December/2025 showed marked reduction in left upper lobe consolidation and resolution of the cavitary process consistent with resolving pneumonia. He is here today for sick visit.  Discussed the use of AI scribe software for clinical note transcription with the patient, who gave verbal consent to proceed.  History of Present Illness Antonio [name] is a 68 year old male who presents with respiratory symptoms and wheezing.  He has been experiencing symptoms consistent with a head cold for approximately one week, including nasal congestion, runny eyes, and a sensation of fullness. There is no shortness of breath or significant cough, although there is occasional post-nasal drip leading to a cough. These symptoms began during a visit to [place] to see his in-laws around Christmas.  He is concerned about a wheezing noise heard when lying on his left side, present for the last three nights. The noise occurs upon exhalation and is significant enough to wake him up, disappearing when he turns to his right side. He associates this new symptom with his recent illness rather than his past pneumonia, as he was asymptomatic prior to this episode.  His wife, who is currently ill with a cough and asthma, shares the same household. He mentions that he would not have sought medical attention if not for his history of pneumonia, as he  would have considered this a typical head cold.  He has not smoked in fifty years and denies any allergies, lung disease, or heart disease. The cough is infrequent and primarily used to clear the noise he hears, rather than being persistent or tickly.  No history of asthma as a child and no significant allergies. He is unsure if he can produce sputum for testing as his cough is dry. The wheezing noise is more pronounced on expiration.      ROS Review of symptoms negative except mentioned above   Allergies: Solifenacin, Gadolinium derivatives, and Hydrochlorothiazide Current Medications[1] Past Medical History:  Diagnosis Date   Hypertension    MS (multiple sclerosis)    Past Surgical History:  Procedure Laterality Date   ACHILLES TENDON REPAIR Left    Family History  Problem Relation Age of Onset   Lymphoma Mother    Cancer Father    Cancer Maternal Grandfather    Multiple sclerosis Neg Hx    Social History   Socioeconomic History   Marital status: Married    Spouse name: Not on file   Number of children: 2   Years of education: Not on file   Highest education level: Not on file  Occupational History   Occupation: Physician  Tobacco Use   Smoking status: Never   Smokeless tobacco: Never  Vaping Use   Vaping status: Never Used  Substance and Sexual Activity   Alcohol use: No   Drug use: No   Sexual activity: Not on  file  Other Topics Concern   Not on file  Social History Narrative   Patient drinks 1 cup of caffeine daily.   Patient is right handed.   Social Drivers of Health   Tobacco Use: Low Risk (02/10/2024)   Patient History    Smoking Tobacco Use: Never    Smokeless Tobacco Use: Never    Passive Exposure: Not on file  Financial Resource Strain: Not on file  Food Insecurity: No Food Insecurity (11/08/2023)   Epic    Worried About Programme Researcher, Broadcasting/film/video in the Last Year: Never true    Ran Out of Food in the Last Year: Never true  Transportation Needs:  No Transportation Needs (11/08/2023)   Epic    Lack of Transportation (Medical): No    Lack of Transportation (Non-Medical): No  Physical Activity: Not on file  Stress: Not on file  Social Connections: Socially Integrated (11/08/2023)   Social Connection and Isolation Panel    Frequency of Communication with Friends and Family: More than three times a week    Frequency of Social Gatherings with Friends and Family: More than three times a week    Attends Religious Services: More than 4 times per year    Active Member of [employer] or Organizations: Yes    Attends Banker Meetings: More than 4 times per year    Marital Status: Married  Catering Manager Violence: Not At Risk (11/08/2023)   Epic    Fear of Current or Ex-Partner: No    Emotionally Abused: No    Physically Abused: No    Sexually Abused: No  Depression (PHQ2-9): Not on file  Alcohol Screen: Not on file  Housing: Low Risk (11/08/2023)   Epic    Unable to Pay for Housing in the Last Year: No    Number of Times Moved in the Last Year: 0    Homeless in the Last Year: No  Utilities: Not At Risk (11/08/2023)   Epic    Threatened with loss of utilities: No  Health Literacy: Not on file         Objective:  BP 124/78   Pulse 72   Temp 98 F (36.7 C)   Ht 5' 9 (1.753 m) Comment: per pt  Wt 168 lb 12.8 oz (76.6 kg)   SpO2 98%   BMI 24.93 kg/m    Physical Exam Constitutional:      General: He is not in acute distress.    Appearance: Normal appearance.  HENT:     Mouth/Throat:     Mouth: Mucous membranes are moist.  Cardiovascular:     Rate and Rhythm: Normal rate.  Pulmonary:     Effort: No respiratory distress.     Breath sounds: Wheezing and rhonchi present. No rales.  Musculoskeletal:     Right lower leg: No edema.     Left lower leg: No edema.  Skin:    General: Skin is warm.  Neurological:     Mental Status: He is alert and oriented to person, place, and time.  Psychiatric:        Mood and  Affect: Mood normal.     Diagnostic Review:    Pft     No data to display               Results Radiology Chest CT: Marked improvement in previous pneumonia with residual fibrosis in the left upper lobe; small area of incomplete resolution. (Independently interpreted)   1. Marked  reduction in left upper lobe consolidation and cavitary process consistent with resolving pulmonary infection. As consolidation and cavitation persist, recommend follow-up CT in 2 to 3 months.   01/13/24 CT chest 1. Further interval improvement of left upper lobe postinflammatory findings with residual band-like density further reduced in size from prior. No residual cavitary component. 2. Mild mid thoracic spondylosis. 3. Several additional chronic and incidental findings including aortic arch and coronary atherosclerosis, small non-pathologic mediastinal lymph nodes, cholelithiasis, and benign calcified pulmonary granulomas, see report body for details.      Assessment & Plan:   Assessment & Plan Acute bronchitis, unspecified organism Ongoing symptoms for 1 week Viral swabs today Chest xray today. Pt unable to produce sputum Ordered steroids and doxycycline   Consider PFT in future Advised pt to consider CT chest in future especially with complains of unilateral wheezing when laying on left side, on exam he has wheezing bilaterally  .advised pt to call us  or visit ED if symptoms get worse Orders:   DG Chest 2 View; Future   Addendum: Checked flu and covid- tested negative Chest xray doesn't show obvious pneumonia Advised pt to take steroids and doxycycline   as prescribed for bronchitis  Thank you for the opportunity to take part in the care of Antonio [name]   Return in about 6 weeks (around 03/23/2024).   Rosalva Neary Pleas, MD [facility] Office: [phone]     [1]  Current Outpatient Medications:    ALPRAZolam  (XANAX ) 0.5 MG tablet, Take 1 tablet (0.5  mg total) by mouth daily as needed for anxiety., Disp: 25 tablet, Rfl: 1   Cholecalciferol (VITAMIN D3) 1000 units CAPS, Take 1,000 Units by mouth at bedtime., Disp: , Rfl:    etodolac  (LODINE ) 200 MG capsule, Take 1 capsule (200 mg total) by mouth every 8 (eight) hours with food as needed., Disp: 30 capsule, Rfl: 1   finasteride  (PROSCAR ) 5 MG tablet, Take 1 tablet (5 mg total) by mouth daily., Disp: 90 tablet, Rfl: 3   fluticasone  (FLONASE ) 50 MCG/ACT nasal spray, Place 2 sprays into both nostrils daily., Disp: 16 g, Rfl: 0   HYDROcodone  bit-homatropine (HYCODAN) 5-1.5 MG/5ML syrup, Take 5 mLs by mouth every 6 (six) hours as needed for cough, Disp: 100 mL, Rfl: 0   losartan  (COZAAR ) 100 MG tablet, Take 1 tablet (100 mg total) by mouth daily., Disp: 90 tablet, Rfl: 3   losartan  (COZAAR ) 100 MG tablet, Take 1 tablet (100 mg total) by mouth daily., Disp: 90 tablet, Rfl: 2   Magnesium  250 MG TABS, Take 500 mg by mouth at bedtime., Disp: , Rfl:    mirabegron  ER (MYRBETRIQ ) 50 MG TB24 tablet, Take 1 tablet (50 mg total) by mouth daily., Disp: 30 tablet, Rfl: 3   modafinil  (PROVIGIL ) 200 MG tablet, Take 1 tablet (200 mg total) by mouth daily., Disp: 90 tablet, Rfl: 1   ondansetron  (ZOFRAN ) 4 MG tablet, Take 1 tablet (4 mg total) by mouth every 8 (eight) hours as needed for nausea or vomiting., Disp: 5 tablet, Rfl: 0   rosuvastatin  (CRESTOR ) 10 MG tablet, Take 1 tablet (10 mg total) by mouth daily., Disp: 90 tablet, Rfl: 3   saccharomyces boulardii (FLORASTOR) 250 MG capsule, Take 1 capsule (250 mg total) by mouth 2 (two) times daily., Disp: 60 capsule, Rfl: 0   tamsulosin  (FLOMAX ) 0.4 MG CAPS capsule, Take 1 capsule (0.4 mg total) by mouth 2 (two) times daily., Disp: 180 capsule, Rfl: 3   Vibegron  (GEMTESA )  75 MG TABS, Take 1 tablet (75 mg total) by mouth daily., Disp: 30 tablet, Rfl: 3   zolpidem  (AMBIEN ) 10 MG tablet, Take 1 tablet (10 mg) by mouth at bedtime., Disp: 90 tablet, Rfl: 1  "

## 2024-02-10 NOTE — Telephone Encounter (Signed)
 Pt has appointment for today with Dr. Pleas. NFN.

## 2024-02-12 ENCOUNTER — Telehealth: Payer: Self-pay

## 2024-02-12 ENCOUNTER — Other Ambulatory Visit (HOSPITAL_COMMUNITY): Payer: Self-pay

## 2024-02-12 MED ORDER — PREDNISONE 10 MG PO TABS
ORAL_TABLET | ORAL | 0 refills | Status: AC
  Filled 2024-02-12 (×4): qty 20, 8d supply, fill #0

## 2024-02-12 NOTE — Telephone Encounter (Signed)
 Reviewed CT which showed interval improvement of left upper lobe postinflammatory findings with residual band-like density further reduced in size from prior. No residual cavitary component.  I will send in a prednisone taper due to reports of ongoing wheezing and shortness of breath  CC Dr. Adrien to consider whether or not a SABA or maintenance inhaler is needed

## 2024-02-12 NOTE — Telephone Encounter (Signed)
 Spoke with the pt. Pt is requesting another rx. Pt is complaining of ongoing wheezing w/ SOB. Pt complains of ongoing dry cough.  Pt is concerned if he needs another round of prednisone.  I advised pt to allow time for Dr. 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  Dr. Adrien has left for the day.  Beth can you advise

## 2024-02-12 NOTE — Telephone Encounter (Signed)
 Pt is aware rx sent to [facility] long. NFN  CC Dr. Sanchez

## 2024-02-12 NOTE — Telephone Encounter (Signed)
 Copied from CRM #[id number]. Topic: Clinical - Medical Advice >> Feb 10, 2024  9:54 AM Isabell A wrote: Reason for CRM: Patient was told if he needed to contact Dr.Sanchez to let the office know & she will call him back.  Callback number: [phone]  ATC x1. LMTCB

## 2024-02-15 NOTE — Telephone Encounter (Signed)
 ATC x1. LMTCB  Front staff please try to schedule the pt

## 2024-02-15 NOTE — Telephone Encounter (Signed)
 Copied from CRM #[id number]. Topic: Clinical - Medical Advice >> Feb 15, 2024  9:35 AM Corean SAUNDERS wrote: Reason for CRM: Message for Marcus: Patient states he will call back in a week after finishing prednisone  and antibiotics course, he is feeling better but still a little congested.  FYI Dr. Adrien pt wants to wait to schedule appt

## 2024-02-17 ENCOUNTER — Other Ambulatory Visit: Payer: Self-pay | Admitting: Neurology

## 2024-02-17 ENCOUNTER — Other Ambulatory Visit (HOSPITAL_COMMUNITY): Payer: Self-pay

## 2024-02-17 DIAGNOSIS — G35D Multiple sclerosis, unspecified: Secondary | ICD-10-CM

## 2024-02-17 MED ORDER — ZOLPIDEM TARTRATE 10 MG PO TABS
10.0000 mg | ORAL_TABLET | Freq: Every day | ORAL | 1 refills | Status: AC
  Filled 2024-02-17 – 2024-02-19 (×2): qty 90, 90d supply, fill #0

## 2024-02-17 NOTE — Telephone Encounter (Signed)
 Requested Prescriptions   Pending Prescriptions Disp Refills   zolpidem  (AMBIEN ) 10 MG tablet 90 tablet 1    Sig: Take 1 tablet (10 mg) by mouth at bedtime.   Last seen 07/16/23 Next appt 02/24/24 Dispenses   Dispensed Days Supply Quantity Provider Pharmacy  zolpidem  (AMBIEN ) 10 MG tablet 11/27/2023 90 90 tablet Sater, Charlie LABOR, MD [place] [facility]  zolpidem  (AMBIEN ) 10 MG tablet 08/31/2023 90 90 tablet Sater, Charlie LABOR, MD [place] [facility]  zolpidem  (AMBIEN ) 10 MG tablet 06/02/2023 90 90 tablet Sater, Charlie LABOR, MD [place] [facility]  zolpidem  (AMBIEN ) 10 MG tablet 03/06/2023 90 90 tablet Sater, Charlie LABOR, MD [place] [facility]SABRASABRA

## 2024-02-18 ENCOUNTER — Other Ambulatory Visit (HOSPITAL_COMMUNITY): Payer: Self-pay

## 2024-02-19 ENCOUNTER — Other Ambulatory Visit (HOSPITAL_COMMUNITY): Payer: Self-pay

## 2024-02-24 ENCOUNTER — Encounter: Payer: Self-pay | Admitting: Neurology

## 2024-02-24 ENCOUNTER — Ambulatory Visit (INDEPENDENT_AMBULATORY_CARE_PROVIDER_SITE_OTHER): Admitting: Neurology

## 2024-02-24 VITALS — BP 105/65 | HR 80 | Ht 69.0 in | Wt 172.0 lb

## 2024-02-24 DIAGNOSIS — R5383 Other fatigue: Secondary | ICD-10-CM

## 2024-02-24 DIAGNOSIS — M21372 Foot drop, left foot: Secondary | ICD-10-CM

## 2024-02-24 DIAGNOSIS — G4700 Insomnia, unspecified: Secondary | ICD-10-CM

## 2024-02-24 DIAGNOSIS — R29898 Other symptoms and signs involving the musculoskeletal system: Secondary | ICD-10-CM | POA: Diagnosis not present

## 2024-02-24 DIAGNOSIS — G35C1 Active secondary progressive multiple sclerosis: Secondary | ICD-10-CM

## 2024-02-24 NOTE — Progress Notes (Signed)
 "  Antonio Cook  PATIENT: Antonio Cook DOB: 1955-02-16  REFERRING DOCTOR OR PCP:  Dr. Janey SOURCE: Patient, notes from Dr. Jenel, imaging and laboratory reports, MRI images personally reviewed.  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    UPDATE 02/24/2024 He reports that his MS is stable.  No exacerbations.    He had a chronic cough 4 months ago and was found to have an aspiration pneumonia. He was hospitalized x 4 months  He has noted gait has slightly worsened over the past couple years..   His legs have weakness bilaterally but he does not need a cane or walker.   We had tried amantadine  and Ampyra  but they were ineffective.   He notes endurance with walking is worse.   He has done PT and notes benefit.without support and has some stumbles but no falls.  He reports left > right leg weakness.  He is trying to exercise some.    Left leg may weaken and alsways feels more tired as the day goes on.  He has some urinary urgency and has had some urge incontinence. He saw urology for bladder dysfunction.  He had cystoscopy.  His issues are felt due to a large prostate.      Vision is doing well   Mood and cognition are doing well.     He has had insomnia but sleeps well with Ambien  and tolerates it well.    Cognitive fatigue does well on Provigil . He takes 1/2 in the morning and 1/2 a few later later.      He has fatigue, physical>cognitive.    He is working full time and stays active as a therapist, sports.     Ampyra  had not helped physical fatigue.      He has LBP when he wakes up and notes gait worsens later I'm the day.   He does exercise most days .     He sees John Oharin, for PT and notes benefit with balance.    MS history: In 2002, he woke  up with reduced right hearing.   He saw his PCP and was referred to [facility] but was foud to have normal hearing.   An MRI was performed showing chages c/w MS.  He did not have other symptoms since then.    He saw Dr. Maurice and was found to have a normal exam.   Rebif  was started.   He saw Dr. Juliane who felt he had a more aggressive MS.    He opted to continue with Dr. Maurice and on Rebif .   He switched to Dr, Jenel 7-8  years ago and had an episode of left leg weakness in 2018 (exacerbation).    He did try Copaxone for a short try.     He went off of medication and has had a stable MRI.    Symptoms became more progressive since about 2020.  He notes doing well as long as he gets a good night sleep helped by Ambien .       MRI Review MRI of the head 09/15/2020 shows mild generalized cortical atrophy.  T2/FLAIR hyperintense foci in the left lateral upper pons and middle cerebellar peduncles..  There are T2/FLAIR hyperintense foci in  the periventricular, juxtacortical and deep white matter of both hemispheres.  None of the foci appear to be acute.  They do not enhance.  Compared to the MRI from 10/24/2014, there were no definite changes.  MRI of the cervical spine 10/24/2014 shows T2 hyperintense foci adjacent to C1-C2 and T3.  There was a normal enhancement pattern.  Mild disc bulges in the upper cervical spine but no nerve root compression.  MRI lumbar 2015 showed multilevel mild DJD with facet hypertrophy and bulges abut no nerve root compression or spinal stenosis.    REVIEW OF SYSTEMS: Constitutional: No fevers, chills, sweats, or change in appetite Eyes: No visual changes, double vision, eye pain Ear, nose and throat: No hearing loss, ear pain, nasal congestion, sore throat Cardiovascular: No chest pain, palpitations Respiratory:  No shortness of breath at rest or with exertion.   No wheezes GastrointestinaI: No nausea, vomiting, diarrhea, abdominal pain, fecal incontinence Genitourinary:  No dysuria,  urinary retention or frequency.  No nocturia. Musculoskeletal:  No neck pain, back pain Integumentary: No rash, pruritus, skin lesions Neurological: as above Psychiatric: No depression at this time.  No anxiety Endocrine: No palpitations, diaphoresis, change in appetite, change in weigh or increased thirst Hematologic/Lymphatic:  No anemia, purpura, petechiae. Allergic/Immunologic: No itchy/runny eyes, nasal congestion, recent allergic reactions, rashes  ALLERGIES: Allergies  Allergen Reactions   Solifenacin Other (See Comments)    Caused a very dry mouth   Gadolinium Derivatives Nausea And Vomiting and Other (See Comments)    Pt vomited after injection   Hydrochlorothiazide Rash and Dermatitis    HOME MEDICATIONS:  Current Outpatient Medications:    ALPRAZolam  (XANAX ) 0.5 MG tablet, Take 1 tablet (0.5 mg total) by mouth daily as needed for anxiety., Disp: 25 tablet, Rfl: 1   Cholecalciferol (VITAMIN D3) 1000 units CAPS, Take 1,000 Units by mouth at bedtime., Disp: , Rfl:    doxycycline  (VIBRA -TABS) 100 MG tablet, Take 1 tablet (100 mg total) by mouth 2 (two) times daily., Disp: 14 tablet, Rfl: 0   etodolac  (LODINE ) 200 MG capsule, Take 1 capsule (200 mg total) by mouth every 8 (eight) hours with food as needed., Disp: 30 capsule, Rfl: 1   finasteride  (PROSCAR ) 5 MG tablet, Take 1 tablet (5 mg total) by mouth daily., Disp: 90 tablet, Rfl: 3   fluticasone  (FLONASE ) 50 MCG/ACT nasal spray, Place 2 sprays into both nostrils daily., Disp: 16 g, Rfl: 0   HYDROcodone  bit-homatropine (HYCODAN) 5-1.5 MG/5ML syrup, Take 5 mLs by mouth every 6 (six) hours as needed for cough, Disp: 100 mL, Rfl: 0   losartan  (COZAAR ) 100 MG tablet, Take 1 tablet (100 mg total) by mouth daily., Disp: 90 tablet, Rfl: 3   losartan  (COZAAR ) 100 MG tablet, Take 1 tablet (100 mg total) by mouth daily., Disp: 90 tablet, Rfl: 2   Magnesium  250 MG TABS, Take 500 mg by mouth at bedtime., Disp: , Rfl:    mirabegron  ER  (MYRBETRIQ ) 50 MG TB24 tablet, Take 1 tablet (50 mg total) by mouth daily., Disp: 30 tablet, Rfl: 3   modafinil  (PROVIGIL ) 200 MG tablet, Take 1 tablet (200 mg total) by mouth daily., Disp: 90 tablet, Rfl: 1   ondansetron  (ZOFRAN ) 4 MG tablet, Take 1 tablet (4 mg total) by mouth every 8 (eight) hours as needed for nausea or vomiting., Disp: 5 tablet, Rfl: 0   rosuvastatin  (CRESTOR ) 10 MG tablet, Take 1 tablet (10 mg total) by mouth daily., Disp: 90 tablet, Rfl: 3   saccharomyces  boulardii (FLORASTOR) 250 MG capsule, Take 1 capsule (250 mg total) by mouth 2 (two) times daily., Disp: 60 capsule, Rfl: 0   tamsulosin  (FLOMAX ) 0.4 MG CAPS capsule, Take 1 capsule (0.4 mg total) by mouth 2 (two) times daily., Disp: 180 capsule, Rfl: 3   zolpidem  (AMBIEN ) 10 MG tablet, Take 1 tablet (10 mg) by mouth at bedtime., Disp: 90 tablet, Rfl: 1   Vibegron  (GEMTESA ) 75 MG TABS, Take 1 tablet (75 mg total) by mouth daily. (Patient not taking: Reported on 02/24/2024), Disp: 30 tablet, Rfl: 3  PAST MEDICAL HISTORY: Past Medical History:  Diagnosis Date   Hypertension    MS (multiple sclerosis)     PAST SURGICAL HISTORY: Past Surgical History:  Procedure Laterality Date   ACHILLES TENDON REPAIR Left     FAMILY HISTORY: Family History  Problem Relation Age of Onset   Lymphoma Mother    Cancer Father    Cancer Maternal Grandfather    Multiple sclerosis Neg Hx     SOCIAL HISTORY:  Social History   Socioeconomic History   Marital status: Married    Spouse name: Not on file   Number of children: 2   Years of education: Not on file   Highest education level: Not on file  Occupational History   Occupation: Physician  Tobacco Use   Smoking status: Never   Smokeless tobacco: Never  Vaping Use   Vaping status: Never Used  Substance and Sexual Activity   Alcohol use: No   Drug use: No   Sexual activity: Not on file  Other Topics Concern   Not on file  Social History Narrative   Patient drinks 1  cup of caffeine daily.   Patient is right handed.   Social Drivers of Health   Tobacco Use: Low Risk (02/24/2024)   Patient History    Smoking Tobacco Use: Never    Smokeless Tobacco Use: Never    Passive Exposure: Not on file  Financial Resource Strain: Not on file  Food Insecurity: No Food Insecurity (11/08/2023)   Epic    Worried About Programme Researcher, Broadcasting/film/video in the Last Year: Never true    Ran Out of Food in the Last Year: Never true  Transportation Needs: No Transportation Needs (11/08/2023)   Epic    Lack of Transportation (Medical): No    Lack of Transportation (Non-Medical): No  Physical Activity: Not on file  Stress: Not on file  Social Connections: Socially Integrated (11/08/2023)   Social Connection and Isolation Panel    Frequency of Communication with Friends and Family: More than three times a week    Frequency of Social Gatherings with Friends and Family: More than three times a week    Attends Religious Services: More than 4 times per year    Active Member of [employer] or Organizations: Yes    Attends Banker Meetings: More than 4 times per year    Marital Status: Married  Catering Manager Violence: Not At Risk (11/08/2023)   Epic    Fear of Current or Ex-Partner: No    Emotionally Abused: No    Physically Abused: No    Sexually Abused: No  Depression (PHQ2-9): Not on file  Alcohol Screen: Not on file  Housing: Low Risk (11/08/2023)   Epic    Unable to Pay for Housing in the Last Year: No    Number of Times Moved in the Last Year: 0    Homeless in the Last Year: No  Utilities: Not At Risk (11/08/2023)   Epic    Threatened with loss of utilities: No  Health Literacy: Not on file     PHYSICAL EXAM  Vitals:   02/24/24 1546  BP: 105/65  Pulse: 80  SpO2: 99%  Weight: 172 lb (78 kg)  Height: 5' 9 (1.753 m)    Body mass index is 25.4 kg/m.   General: The patient is well-developed and well-nourished and in no acute distress  HEENT:  Head is  White/AT.  Sclera are anicteric.    Skin: Extremities are without rash or  edema.  Neurologic Exam  Mental status: The patient is alert and oriented x 3 at the time of the examination. The patient has apparent normal recent and remote memory, with an apparently normal attention span and concentration ability.   Speech is normal.  Cranial nerves: Extraocular movements are full.  Color vision was normal.  Patient strength and sensation was normal.  No dysarthria.  No obvious hearing deficits are noted.  Motor:  Muscle bulk is normal.   Tone is normal. Strength is 4+/5 in the iliopsoas muscles and 5 / 5 elsewhere in the 4 extremities.   Sensory: Sensory testing is intact to pinprick, soft touch and vibration sensation in all 4 extremities.  Coordination: Cerebellar testing reveals good finger-nose-finger and heel-to-shin bilaterally.  Gait and station: Station is normal.  There is a mild left foot drop.  The gait is mildly wide.  Tandem gait is wide.  Romberg is negative.  Reflexes: Deep tendon reflexes are symmetric and normal in the arms but increased in the legs with crossed abductors at the knees.  Ankle reflexes were normal.    DIAGNOSTIC DATA (LABS, IMAGING, TESTING) - I reviewed patient records, labs, notes, testing and imaging myself where available.  Lab Results  Component Value Date   WBC 16.5 (H) 11/15/2023   HGB 12.1 (L) 11/15/2023   HCT 36.4 (L) 11/15/2023   MCV 85.0 11/15/2023   PLT 378 11/15/2023      Component Value Date/Time   NA 138 11/15/2023 1512   NA 141 10/11/2014 1158   K 3.8 11/15/2023 1512   CL 103 11/15/2023 1512   CO2 24 11/15/2023 1512   GLUCOSE 106 (H) 11/15/2023 1512   BUN 13 11/15/2023 1512   BUN 21 10/11/2014 1158   CREATININE 0.99 11/15/2023 1512   CALCIUM  9.6 11/15/2023 1512   PROT 7.9 11/15/2023 1512   PROT 6.8 10/11/2014 1158   ALBUMIN 3.6 11/15/2023 1512   ALBUMIN 4.3 10/11/2014 1158   AST 30 11/15/2023 1512   ALT 46 (H) 11/15/2023 1512    ALKPHOS 71 11/15/2023 1512   BILITOT 0.4 11/15/2023 1512   BILITOT <0.2 10/11/2014 1158   GFRNONAA >60 11/15/2023 1512   GFRAA 87 10/11/2014 1158       ASSESSMENT AND PLAN  Active secondary progressive multiple sclerosis  Left foot drop  Insomnia, unspecified type  Weakness of left leg  Other fatigue   He will remain off of a disease modifying therapy.  He has had some slow progression.  We discussed some of the medications in the pipeline for secondary progressive MS. continue modafinil  for excessive daytime sleepiness and Ambien  for insomnia at night Stay active and exercise as tolerated.  PT/rehab to help with leg strength and gait Return in 6 months but call sooner if there are new or worsening neurologic symptoms.  42-minute office visit with the majority of the time spent face-to-face for history and  physical, discussion/counseling and decision-making.  Additional time with record review and documentation.   This visit is part of a comprehensive longitudinal care medical relationship regarding the patients primary diagnosis of multiple sclerosis and related concerns.   Marlon Vonruden A. Vear, MD, [facility] 02/24/2024, 10:29 PM Certified in Neurology, Clinical Neurophysiology, Sleep Medicine and Neuroimaging  [facility] [address] [place], [place] [address] [phone] "

## 2024-03-17 ENCOUNTER — Other Ambulatory Visit (HOSPITAL_COMMUNITY): Payer: Self-pay

## 2024-10-06 ENCOUNTER — Ambulatory Visit: Admitting: Neurology
